# Patient Record
Sex: Female | Born: 1990 | Race: Black or African American | Hispanic: No | Marital: Single | Smoking: Never smoker
Health system: Southern US, Community
[De-identification: ages and names within clinical notes are randomized; demographics above are authoritative.]

## PROBLEM LIST (undated history)

## (undated) ENCOUNTER — Inpatient Hospital Stay: Payer: Self-pay

## (undated) ENCOUNTER — Emergency Department: Admission: EM | Payer: Medicaid Other | Source: Home / Self Care

## (undated) DIAGNOSIS — N83209 Unspecified ovarian cyst, unspecified side: Secondary | ICD-10-CM

## (undated) DIAGNOSIS — K529 Noninfective gastroenteritis and colitis, unspecified: Secondary | ICD-10-CM

---

## 2013-03-10 ENCOUNTER — Encounter (HOSPITAL_COMMUNITY): Payer: Self-pay | Admitting: Emergency Medicine

## 2013-03-10 ENCOUNTER — Emergency Department (HOSPITAL_COMMUNITY)
Admission: EM | Admit: 2013-03-10 | Discharge: 2013-03-10 | Disposition: A | Discharge status: Home / Self Care | Payer: No Typology Code available for payment source | Attending: Emergency Medicine | Admitting: Emergency Medicine

## 2013-03-10 DIAGNOSIS — Y92410 Unspecified street and highway as the place of occurrence of the external cause: Secondary | ICD-10-CM | POA: Insufficient documentation

## 2013-03-10 DIAGNOSIS — S50812A Abrasion of left forearm, initial encounter: Secondary | ICD-10-CM

## 2013-03-10 DIAGNOSIS — Y9389 Activity, other specified: Secondary | ICD-10-CM | POA: Insufficient documentation

## 2013-03-10 DIAGNOSIS — IMO0002 Reserved for concepts with insufficient information to code with codable children: Secondary | ICD-10-CM | POA: Insufficient documentation

## 2013-03-10 DIAGNOSIS — Z880 Allergy status to penicillin: Secondary | ICD-10-CM | POA: Insufficient documentation

## 2013-03-10 LAB — URINALYSIS, ROUTINE W REFLEX MICROSCOPIC
Bilirubin Urine: NEGATIVE
Glucose, UA: NEGATIVE mg/dL
Hgb urine dipstick: NEGATIVE
Ketones, ur: NEGATIVE mg/dL
Protein, ur: NEGATIVE mg/dL
Urobilinogen, UA: 0.2 mg/dL (ref 0.0–1.0)

## 2013-03-10 MED ORDER — IBUPROFEN 800 MG PO TABS: 800.0000 mg | ORAL_TABLET | Freq: Three times a day (TID) | ORAL | Status: DC

## 2013-03-10 MED ORDER — IBUPROFEN 400 MG PO TABS
600.0000 mg | ORAL_TABLET | Freq: Once | ORAL | Status: AC
  Administered 2013-03-10: 600 mg via ORAL
  Filled 2013-03-10: qty 1
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
## 2013-03-11 NOTE — ED Provider Notes (Signed)
Medical screening examination/treatment/procedure(s) were performed by non-physician practitioner and as supervising physician I was immediately available for consultation/collaboration.  Arley Phenix, MD 03/11/13 [phone]

## 2013-12-15 ENCOUNTER — Emergency Department (INDEPENDENT_AMBULATORY_CARE_PROVIDER_SITE_OTHER)
Admission: EM | Admit: 2013-12-15 | Discharge: 2013-12-15 | Disposition: A | Discharge status: Home / Self Care | Payer: Self-pay | Source: Home / Self Care | Attending: Emergency Medicine | Admitting: Emergency Medicine

## 2013-12-15 ENCOUNTER — Encounter (HOSPITAL_COMMUNITY): Payer: Self-pay | Admitting: Emergency Medicine

## 2013-12-15 ENCOUNTER — Other Ambulatory Visit (HOSPITAL_COMMUNITY)
Admission: RE | Admit: 2013-12-15 | Discharge: 2013-12-15 | Disposition: A | Discharge status: Home / Self Care | Payer: Medicaid Other | Source: Ambulatory Visit | Attending: Emergency Medicine | Admitting: Emergency Medicine

## 2013-12-15 DIAGNOSIS — Z202 Contact with and (suspected) exposure to infections with a predominantly sexual mode of transmission: Secondary | ICD-10-CM

## 2013-12-15 DIAGNOSIS — Z113 Encounter for screening for infections with a predominantly sexual mode of transmission: Secondary | ICD-10-CM | POA: Insufficient documentation

## 2013-12-15 DIAGNOSIS — N760 Acute vaginitis: Secondary | ICD-10-CM | POA: Insufficient documentation

## 2013-12-15 LAB — POCT URINALYSIS DIP (DEVICE)
Bilirubin Urine: NEGATIVE
GLUCOSE, UA: NEGATIVE mg/dL
Ketones, ur: NEGATIVE mg/dL
Leukocytes, UA: NEGATIVE
NITRITE: NEGATIVE
Protein, ur: NEGATIVE mg/dL
Specific Gravity, Urine: 1.025 (ref 1.005–1.030)
UROBILINOGEN UA: 0.2 mg/dL (ref 0.0–1.0)
pH: 7 (ref 5.0–8.0)

## 2013-12-15 LAB — POCT PREGNANCY, URINE: PREG TEST UR: NEGATIVE

## 2013-12-15 NOTE — ED Notes (Signed)
Call back number for lab issues verified 

## 2013-12-15 NOTE — ED Provider Notes (Signed)
  Chief Complaint   Chief Complaint  Patient presents with  . Exposure to STD    History of Present Illness   Marilyn Rojas is a 23 year old female who is in today to be checked for STDs and she herself is asymptomatic. Her girlfriend is here today with a sore throat. The patient denies any sore throat, fever, chills, joint pain or skin rash. She's had no abdominal pain, pelvic pain, vaginal discharge, genital lesions, itching, or irritation.  Review of Systems   Other than as noted above, the patient denies any of the following symptoms: Systemic:  No fever or chills GI:  No abdominal pain, nausea, vomiting, diarrhea, constipation, melena or hematochezia. GU:  No dysuria, frequency, urgency, hematuria, vaginal discharge, itching, or abnormal vaginal bleeding.  PMFSH   Past medical history, family history, social history, meds, and allergies were reviewed.    Physical Examination    Vital signs:  BP 112/76  Pulse 68  Temp(Src) 98 F (36.7 C) (Oral)  Resp 16  SpO2 100% General:  Alert, oriented and in no distress. Lungs:  Breath sounds clear and equal bilaterally.  No wheezes, rales or rhonchi. Heart:  Regular rhythm.  No gallops or murmers. Abdomen:  Soft, flat and non-distended.  No organomegaly or mass.  No tenderness, guarding or rebound.  Bowel sounds normally active. Pelvic exam:  Normal external genitalia, vaginal and cervical mucosa were normal. No vaginal discharge, no pain on cervical motion, uterus was normal in size and shape and nontender. No adnexal masses or tenderness.  DNA probes for gonorrhea, Chlamydia, Trichomonas, Gardnerella, Candida were obtained. Skin:  Clear, warm and dry.  Labs   Results for orders placed during the hospital encounter of 12/15/13  POCT URINALYSIS DIP (DEVICE)      Result Value Ref Range   Glucose, UA NEGATIVE  NEGATIVE mg/dL   Bilirubin Urine NEGATIVE  NEGATIVE   Ketones, ur NEGATIVE  NEGATIVE mg/dL   Specific Gravity, Urine  1.025  1.005 - 1.030   Hgb urine dipstick TRACE (*) NEGATIVE   pH 7.0  5.0 - 8.0   Protein, ur NEGATIVE  NEGATIVE mg/dL   Urobilinogen, UA 0.2  0.0 - 1.0 mg/dL   Nitrite NEGATIVE  NEGATIVE   Leukocytes, UA NEGATIVE  NEGATIVE  POCT PREGNANCY, URINE      Result Value Ref Range   Preg Test, Ur NEGATIVE  NEGATIVE    Serologies for HIV and syphilis were obtained. Also a throat culture was obtained for gonorrhea and Chlamydia DNA probe.  Assessment   The encounter diagnosis was Potential exposure to STD.  No evidence of STDs right now. Will call patient back about any positive results.       Plan    1.  Meds:  The following meds were prescribed:   Discharge Medication List as of 12/15/2013  5:36 PM      2.  Patient Education/Counseling:  The patient was given appropriate handouts, self care instructions, and instructed in symptomatic relief.    3.  Follow up:  The patient was told to follow up here if no better in 3 to 4 days, or sooner if becoming worse in any way, and given some red flag symptoms such as worsening pain, fever, persistent vomiting, or heavy vaginal bleeding which would prompt immediate return.       Reuben Likesavid C Hadriel Northup, MD 12/15/13 [phone]

## 2013-12-15 NOTE — Discharge Instructions (Signed)
Sexually Transmitted Disease A sexually transmitted disease (STD) is a disease or infection that may be passed (transmitted) from person to person, usually during sexual activity. This may happen by way of saliva, semen, blood, vaginal mucus, or urine. Common STDs include:   Gonorrhea.   Chlamydia.   Syphilis.   HIV and AIDS.   Genital herpes.   Hepatitis B and C.   Trichomonas.   Human papillomavirus (HPV).   Pubic lice.   Scabies.  Mites.  Bacterial vaginosis. WHAT ARE CAUSES OF STDs? An STD may be caused by bacteria, a virus, or parasites. STDs are often transmitted during sexual activity if one person is infected. However, they may also be transmitted through nonsexual means. STDs may be transmitted after:   Sexual intercourse with an infected person.   Sharing sex toys with an infected person.   Sharing needles with an infected person or using unclean piercing or tattoo needles.  Having intimate contact with the genitals, mouth, or rectal areas of an infected person.   Exposure to infected fluids during birth. WHAT ARE THE SIGNS AND SYMPTOMS OF STDs? Different STDs have different symptoms. Some people may not have any symptoms. If symptoms are present, they may include:   Painful or bloody urination.   Pain in the pelvis, abdomen, vagina, anus, throat, or eyes.   Skin rash, itching, irritation, growths, sores (lesions), ulcerations, or warts in the genital or anal area.  Abnormal vaginal discharge with or without bad odor.   Penile discharge in men.   Fever.   Pain or bleeding during sexual intercourse.   Swollen glands in the groin area.   Yellow skin and eyes (jaundice). This is seen with hepatitis.   Swollen testicles.  Infertility.  Sores and blisters in the mouth. HOW ARE STDs DIAGNOSED? To make a diagnosis, your health care provider may:   Take a medical history.   Perform a physical exam.   Take a sample of any  discharge for examination.  Swab the throat, cervix, opening to the penis, rectum, or vagina for testing.  Test a sample of your first morning urine.   Perform blood tests.   Perform a Pap smear, if this applies.   Perform a colposcopy.   Perform a laparoscopy.  HOW ARE STDs TREATED? Treatment depends on the STD. Some STDs may be treated but not cured.   Chlamydia, gonorrhea, trichomonas, and syphilis can be cured with antibiotics.   Genital herpes, hepatitis, and HIV can be treated, but not cured, with prescribed medicines. The medicines lessen symptoms.   Genital warts from HPV can be treated with medicine or by freezing, burning (electrocautery), or surgery. Warts may come back.   HPV cannot be cured with medicine or surgery. However, abnormal areas may be removed from the cervix, vagina, or vulva.   If your diagnosis is confirmed, your recent sexual partners need treatment. This is true even if they are symptom-free or have a negative culture or evaluation. They should not have sex until their health care providers say it is OK. HOW CAN I REDUCE MY RISK OF GETTING AN STD?  Use latex condoms, dental dams, and water-soluble lubricants during sexual activity. Do not use petroleum jelly or oils.  Get vaccinated for HPV and hepatitis. If you have not received these vaccines in the past, talk to your health care provider about whether one or both might be right for you.   Avoid risky sex practices that can break the skin.  WHAT SHOULD   I DO IF I THINK I HAVE AN STD?  See your health care provider.   Inform all sexual partners. They should be tested and treated for any STDs.  Do not have sex until your health care provider says it is OK. WHEN SHOULD I GET HELP? Seek immediate medical care if:  You develop severe abdominal pain.  You are a man and notice swelling or pain in the testicles.  You are a woman and notice swelling or pain in your vagina. Document  Released: 12/07/2002 Document Revised: 07/07/2013 Document Reviewed: 04/06/2013 [employer] Patient Information 2014 [employer]  

## 2013-12-15 NOTE — ED Notes (Signed)
Pt not having any symptoms, but requesting STD testing , since her partner is c/o ST

## 2013-12-16 LAB — CERVICOVAGINAL ANCILLARY ONLY
CHLAMYDIA, DNA PROBE: NEGATIVE
NEISSERIA GONORRHEA: NEGATIVE
WET PREP (BD AFFIRM): NEGATIVE
Wet Prep (BD Affirm): NEGATIVE
Wet Prep (BD Affirm): NEGATIVE

## 2013-12-16 LAB — RPR: RPR Ser Ql: NONREACTIVE

## 2013-12-16 LAB — HIV ANTIBODY (ROUTINE TESTING W REFLEX): HIV: NONREACTIVE

## 2013-12-16 LAB — CYTOLOGY, (ORAL, ANAL, URETHRAL) ANCILLARY ONLY
Chlamydia: NEGATIVE
Neisseria Gonorrhea: NEGATIVE

## 2014-11-30 ENCOUNTER — Emergency Department: Payer: Self-pay | Admitting: Emergency Medicine

## 2015-08-23 ENCOUNTER — Encounter: Payer: Self-pay | Admitting: Emergency Medicine

## 2015-08-23 ENCOUNTER — Emergency Department
Admission: EM | Admit: 2015-08-23 | Discharge: 2015-08-23 | Disposition: A | Discharge status: Home / Self Care | Payer: Medicaid Other | Attending: Emergency Medicine | Admitting: Emergency Medicine

## 2015-08-23 ENCOUNTER — Emergency Department: Payer: Medicaid Other

## 2015-08-23 DIAGNOSIS — Z3491 Encounter for supervision of normal pregnancy, unspecified, first trimester: Secondary | ICD-10-CM

## 2015-08-23 DIAGNOSIS — Z880 Allergy status to penicillin: Secondary | ICD-10-CM | POA: Diagnosis not present

## 2015-08-23 DIAGNOSIS — N898 Other specified noninflammatory disorders of vagina: Secondary | ICD-10-CM | POA: Diagnosis not present

## 2015-08-23 DIAGNOSIS — Z3A01 Less than 8 weeks gestation of pregnancy: Secondary | ICD-10-CM | POA: Insufficient documentation

## 2015-08-23 DIAGNOSIS — O23591 Infection of other part of genital tract in pregnancy, first trimester: Secondary | ICD-10-CM | POA: Insufficient documentation

## 2015-08-23 DIAGNOSIS — O26891 Other specified pregnancy related conditions, first trimester: Secondary | ICD-10-CM

## 2015-08-23 LAB — URINALYSIS COMPLETE WITH MICROSCOPIC (ARMC ONLY)
BILIRUBIN URINE: NEGATIVE
GLUCOSE, UA: NEGATIVE mg/dL
Hgb urine dipstick: NEGATIVE
KETONES UR: NEGATIVE mg/dL
LEUKOCYTES UA: NEGATIVE
Nitrite: NEGATIVE
PROTEIN: NEGATIVE mg/dL
SPECIFIC GRAVITY, URINE: 1.006 (ref 1.005–1.030)
pH: 6 (ref 5.0–8.0)

## 2015-08-23 LAB — TYPE AND SCREEN
ABO/RH(D): A POS
Antibody Screen: NEGATIVE

## 2015-08-23 LAB — CHLAMYDIA/NGC RT PCR (ARMC ONLY)
CHLAMYDIA TR: NOT DETECTED
N GONORRHOEAE: NOT DETECTED

## 2015-08-23 LAB — WET PREP, GENITAL
SPERM: NONE SEEN
Trich, Wet Prep: NONE SEEN
Yeast Wet Prep HPF POC: NONE SEEN

## 2015-08-23 LAB — ABO/RH: ABO/RH(D): A POS

## 2015-08-23 LAB — HCG, QUANTITATIVE, PREGNANCY: hCG, Beta Chain, Quant, S: 128572 m[IU]/mL — ABNORMAL HIGH (ref <5)

## 2015-08-23 MED ORDER — METRONIDAZOLE 500 MG PO TABS: 500.0000 mg | ORAL_TABLET | Freq: Two times a day (BID) | ORAL | Status: DC

## 2015-08-23 MED ORDER — CEFTRIAXONE SODIUM 250 MG IJ SOLR
250.0000 mg | INTRAMUSCULAR | Status: DC
  Administered 2015-08-23: 250 mg via INTRAMUSCULAR
  Filled 2015-08-23: qty 250

## 2015-08-23 MED ORDER — AZITHROMYCIN 250 MG PO TABS
1000.0000 mg | ORAL_TABLET | Freq: Once | ORAL | Status: AC
  Administered 2015-08-23: 1000 mg via ORAL
  Filled 2015-08-23: qty 4

## 2015-08-23 NOTE — Discharge Instructions (Signed)
First Trimester of Pregnancy °The first trimester of pregnancy is from week 1 until the end of week 12 (months 1 through 3). A week after a sperm fertilizes an egg, the egg will implant on the wall of the uterus. This embryo will begin to develop into a baby. Genes from you and your partner are forming the baby. The female genes determine whether the baby is a boy or a girl. At 6-8 weeks, the eyes and face are formed, and the heartbeat can be seen on ultrasound. At the end of 12 weeks, all the baby's organs are formed.  °Now that you are pregnant, you will want to do everything you can to have a healthy baby. Two of the most important things are to get good prenatal care and to follow your health care provider's instructions. Prenatal care is all the medical care you receive before the baby's birth. This care will help prevent, find, and treat any problems during the pregnancy and childbirth. °BODY CHANGES °Your body goes through many changes during pregnancy. The changes vary from woman to woman.  °· You may gain or lose a couple of pounds at first. °· You may feel sick to your stomach (nauseous) and throw up (vomit). If the vomiting is uncontrollable, call your health care provider. °· You may tire easily. °· You may develop headaches that can be relieved by medicines approved by your health care provider. °· You may urinate more often. Painful urination may mean you have a bladder infection. °· You may develop heartburn as a result of your pregnancy. °· You may develop constipation because certain hormones are causing the muscles that push waste through your intestines to slow down. °· You may develop hemorrhoids or swollen, bulging veins (varicose veins). °· Your breasts may begin to grow larger and become tender. Your nipples may stick out more, and the tissue that surrounds them (areola) may become darker. °· Your gums may bleed and may be sensitive to brushing and flossing. °· Dark spots or blotches (chloasma,  mask of pregnancy) may develop on your face. This will likely fade after the baby is born. °· Your menstrual periods will stop. °· You may have a loss of appetite. °· You may develop cravings for certain kinds of food. °· You may have changes in your emotions from day to day, such as being excited to be pregnant or being concerned that something may go wrong with the pregnancy and baby. °· You may have more vivid and strange dreams. °· You may have changes in your hair. These can include thickening of your hair, rapid growth, and changes in texture. Some women also have hair loss during or after pregnancy, or hair that feels dry or thin. Your hair will most likely return to normal after your baby is born. °WHAT TO EXPECT AT YOUR PRENATAL VISITS °During a routine prenatal visit: °· You will be weighed to make sure you and the baby are growing normally. °· Your blood pressure will be taken. °· Your abdomen will be measured to track your baby's growth. °· The fetal heartbeat will be listened to starting around week 10 or 12 of your pregnancy. °· Test results from any previous visits will be discussed. °Your health care provider may ask you: °· How you are feeling. °· If you are feeling the baby move. °· If you have had any abnormal symptoms, such as leaking fluid, bleeding, severe headaches, or abdominal cramping. °· If you are using any tobacco products,   including cigarettes, chewing tobacco, and electronic cigarettes. °· If you have any questions. °Other tests that may be performed during your first trimester include: °· Blood tests to find your blood type and to check for the presence of any previous infections. They will also be used to check for low iron levels (anemia) and Rh antibodies. Later in the pregnancy, blood tests for diabetes will be done along with other tests if problems develop. °· Urine tests to check for infections, diabetes, or protein in the urine. °· An ultrasound to confirm the proper growth  and development of the baby. °· An amniocentesis to check for possible genetic problems. °· Fetal screens for spina bifida and Down syndrome. °· You may need other tests to make sure you and the baby are doing well. °· HIV (human immunodeficiency virus) testing. Routine prenatal testing includes screening for HIV, unless you choose not to have this test. °HOME CARE INSTRUCTIONS  °Medicines °· Follow your health care provider's instructions regarding medicine use. Specific medicines may be either safe or unsafe to take during pregnancy. °· Take your prenatal vitamins as directed. °· If you develop constipation, try taking a stool softener if your health care provider approves. °Diet °· Eat regular, well-balanced meals. Choose a variety of foods, such as meat or vegetable-based protein, fish, milk and low-fat dairy products, vegetables, fruits, and whole grain breads and cereals. Your health care provider will help you determine the amount of weight gain that is right for you. °· Avoid raw meat and uncooked cheese. These carry germs that can cause birth defects in the baby. °· Eating four or five small meals rather than three large meals a day may help relieve nausea and vomiting. If you start to feel nauseous, eating a few soda crackers can be helpful. Drinking liquids between meals instead of during meals also seems to help nausea and vomiting. °· If you develop constipation, eat more high-fiber foods, such as fresh vegetables or fruit and whole grains. Drink enough fluids to keep your urine clear or pale yellow. °Activity and Exercise °· Exercise only as directed by your health care provider. Exercising will help you: °¨ Control your weight. °¨ Stay in shape. °¨ Be prepared for labor and delivery. °· Experiencing pain or cramping in the lower abdomen or low back is a good sign that you should stop exercising. Check with your health care provider before continuing normal exercises. °· Try to avoid standing for long  periods of time. Move your legs often if you must stand in one place for a long time. °· Avoid heavy lifting. °· Wear low-heeled shoes, and practice good posture. °· You may continue to have sex unless your health care provider directs you otherwise. °Relief of Pain or Discomfort °· Wear a good support bra for breast tenderness.   °· Take warm sitz baths to soothe any pain or discomfort caused by hemorrhoids. Use hemorrhoid cream if your health care provider approves.   °· Rest with your legs elevated if you have leg cramps or low back pain. °· If you develop varicose veins in your legs, wear support hose. Elevate your feet for 15 minutes, 3-4 times a day. Limit salt in your diet. °Prenatal Care °· Schedule your prenatal visits by the twelfth week of pregnancy. They are usually scheduled monthly at first, then more often in the last 2 months before delivery. °· Write down your questions. Take them to your prenatal visits. °· Keep all your prenatal visits as directed by your   health care provider. °Safety °· Wear your seat belt at all times when driving. °· Make a list of emergency phone numbers, including numbers for family, friends, the hospital, and police and fire departments. °General Tips °· Ask your health care provider for a referral to a local prenatal education class. Begin classes no later than at the beginning of month 6 of your pregnancy. °· Ask for help if you have counseling or nutritional needs during pregnancy. Your health care provider can offer advice or refer you to specialists for help with various needs. °· Do not use hot tubs, steam rooms, or saunas. °· Do not douche or use tampons or scented sanitary pads. °· Do not cross your legs for long periods of time. °· Avoid cat litter boxes and soil used by cats. These carry germs that can cause birth defects in the baby and possibly loss of the fetus by miscarriage or stillbirth. °· Avoid all smoking, herbs, alcohol, and medicines not prescribed by  your health care provider. Chemicals in these affect the formation and growth of the baby. °· Do not use any tobacco products, including cigarettes, chewing tobacco, and electronic cigarettes. If you need help quitting, ask your health care provider. You may receive counseling support and other resources to help you quit. °· Schedule a dentist appointment. At home, brush your teeth with a soft toothbrush and be gentle when you floss. °SEEK MEDICAL CARE IF:  °· You have dizziness. °· You have mild pelvic cramps, pelvic pressure, or nagging pain in the abdominal area. °· You have persistent nausea, vomiting, or diarrhea. °· You have a bad smelling vaginal discharge. °· You have pain with urination. °· You notice increased swelling in your face, hands, legs, or ankles. °SEEK IMMEDIATE MEDICAL CARE IF:  °· You have a fever. °· You are leaking fluid from your vagina. °· You have spotting or bleeding from your vagina. °· You have severe abdominal cramping or pain. °· You have rapid weight gain or loss. °· You vomit blood or material that looks like coffee grounds. °· You are exposed to German measles and have never had them. °· You are exposed to fifth disease or chickenpox. °· You develop a severe headache. °· You have shortness of breath. °· You have any kind of trauma, such as from a fall or a car accident. °  °This information is not intended to replace advice given to you by your health care provider. Make sure you discuss any questions you have with your health care provider. °  °Document Released: 09/10/2001 Document Revised: 10/07/2014 Document Reviewed: 07/27/2013 °Elsevier Interactive Patient Education ©2016 [employer] °Bacterial Vaginosis °Bacterial vaginosis is a vaginal infection that occurs when the normal balance of bacteria in the vagina is disrupted. It results from an overgrowth of certain bacteria. This is the most common vaginal infection in women of childbearing age. Treatment is important to  prevent complications, especially in pregnant women, as it can cause a premature delivery. °CAUSES  °Bacterial vaginosis is caused by an increase in harmful bacteria that are normally present in smaller amounts in the vagina. Several different kinds of bacteria can cause bacterial vaginosis. However, the reason that the condition develops is not fully understood. °RISK FACTORS °Certain activities or behaviors can put you at an increased risk of developing bacterial vaginosis, including: °· Having a new sex partner or multiple sex partners. °· Douching. °· Using an intrauterine device (IUD) for contraception. °Women do not get bacterial vaginosis from toilet seats,   bedding, swimming pools, or contact with objects around them. °SIGNS AND SYMPTOMS  °Some women with bacterial vaginosis have no signs or symptoms. Common symptoms include: °· Grey vaginal discharge. °· A fishlike odor with discharge, especially after sexual intercourse. °· Itching or burning of the vagina and vulva. °· Burning or pain with urination. °DIAGNOSIS  °Your health care provider will take a medical history and examine the vagina for signs of bacterial vaginosis. A sample of vaginal fluid may be taken. Your health care provider will look at this sample under a microscope to check for bacteria and abnormal cells. A vaginal pH test may also be done.  °TREATMENT  °Bacterial vaginosis may be treated with antibiotic medicines. These may be given in the form of a pill or a vaginal cream. A second round of antibiotics may be prescribed if the condition comes back after treatment. Because bacterial vaginosis increases your risk for sexually transmitted diseases, getting treated can help reduce your risk for chlamydia, gonorrhea, HIV, and herpes. °HOME CARE INSTRUCTIONS  °· Only take over-the-counter or prescription medicines as directed by your health care provider. °· If antibiotic medicine was prescribed, take it as directed. Make sure you finish it  even if you start to feel better. °· Tell all sexual partners that you have a vaginal infection. They should see their health care provider and be treated if they have problems, such as a mild rash or itching. °· During treatment, it is important that you follow these instructions: °¨ Avoid sexual activity or use condoms correctly. °¨ Do not douche. °¨ Avoid alcohol as directed by your health care provider. °¨ Avoid breastfeeding as directed by your health care provider. °SEEK MEDICAL CARE IF:  °· Your symptoms are not improving after 3 days of treatment. °· You have increased discharge or pain. °· You have a fever. °MAKE SURE YOU:  °· Understand these instructions. °· Will watch your condition. °· Will get help right away if you are not doing well or get worse. °FOR MORE INFORMATION  °Centers for Disease Control and Prevention, Division of STD Prevention: www.cdc.gov/std °American Sexual Health Association (ASHA): www.ashastd.org  °  °This information is not intended to replace advice given to you by your health care provider. Make sure you discuss any questions you have with your health care provider. °  °Document Released: 09/16/2005 Document Revised: 10/07/2014 Document Reviewed: 04/28/2013 °Elsevier Interactive Patient Education ©2016 [employer]vier Inc. ° °

## 2015-08-23 NOTE — ED Provider Notes (Addendum)
[facility] Emergency Department Provider Note     Time seen: ----------------------------------------- 3:21 PM on 08/23/2015 -----------------------------------------    I have reviewed the triage vital signs and the nursing notes.   HISTORY  Chief Complaint Vaginal Discharge and Possible Pregnancy    HPI Marilyn Rojas is a 24 y.o. female who presents ER with brown vaginal discharge today with some cramping. Patient states she is around [redacted] weeks pregnant, [facility]. Denies fevers chills or other complaints. Patient states she is G2 P1 Ab1. She had an elective abortion around 9 weeks and her last pregnancy. She denies any significant abdominal pain.   History reviewed. No pertinent past medical history.  There are no active problems to display for this patient.   History reviewed. No pertinent past surgical history.  Allergies Amoxicillin  Social History Social History  Substance Use Topics  . Smoking status: Never Smoker   . Smokeless tobacco: None  . Alcohol Use: No    Review of Systems Constitutional: Negative for fever. Eyes: Negative for visual changes. ENT: Negative for sore throat. Cardiovascular: Negative for chest pain. Respiratory: Negative for shortness of breath. Gastrointestinal: Negative for abdominal pain, vomiting and diarrhea. Genitourinary: Negative for dysuria. Positive for brown vaginal discharge Musculoskeletal: Negative for back pain. Skin: Negative for rash. Neurological: Negative for headaches, focal weakness or numbness.  10-point ROS otherwise negative.  ____________________________________________   PHYSICAL EXAM:  VITAL SIGNS: ED Triage Vitals  Enc Vitals Group     BP 08/23/15 1420 126/84 mmHg     Pulse Rate 08/23/15 1420 87     Resp 08/23/15 1420 18     Temp 08/23/15 1420 98.4 F (36.9 C)     Temp Source 08/23/15 1420 Oral     SpO2 08/23/15 1420 100 %   Weight 08/23/15 1420 107 lb (48.535 kg)     Height 08/23/15 1420 5\' 4"  (1.626 m)     Head Cir --      Peak Flow --      Pain Score 08/23/15 1421 2     Pain Loc --      Pain Edu? --      Excl. in GC? --     Constitutional: Alert and oriented. Well appearing and in no distress. Eyes: Conjunctivae are normal. PERRL. Normal extraocular movements. ENT   Head: Normocephalic and atraumatic.   Nose: No congestion/rhinnorhea.   Mouth/Throat: Mucous membranes are moist.   Neck: No stridor. Cardiovascular: Normal rate, regular rhythm. Normal and symmetric distal pulses are present in all extremities. No murmurs, rubs, or gallops. Respiratory: Normal respiratory effort without tachypnea nor retractions. Breath sounds are clear and equal bilaterally. No wheezes/rales/rhonchi. Gastrointestinal: Soft and nontender. No distention. No abdominal bruits.  Genitourinary: There is thin brown vaginal discharge, cervix is closed. No cervical motion tenderness. Musculoskeletal: Nontender with normal range of motion in all extremities. No joint effusions.  No lower extremity tenderness nor edema. Neurologic:  Normal speech and language. No gross focal neurologic deficits are appreciated. Speech is normal. No gait instability. Skin:  Skin is warm, dry and intact. No rash noted. Psychiatric: Mood and affect are normal. Speech and behavior are normal. Patient exhibits appropriate insight and judgment. ____________________________________________  ED COURSE:  Pertinent labs & imaging results that were available during my care of the patient were reviewed by me and considered in my medical decision making (see chart for details). Patient's acute distress, will check basic labs and urine  and wet prep. Patient will likely need ultrasound as well ____________________________________________    LABS (pertinent positives/negatives)  Labs Reviewed  WET PREP, GENITAL - Abnormal; Notable for the  following:    Clue Cells Wet Prep HPF POC FEW (*)    WBC, Wet Prep HPF POC MANY (*)    All other components within normal limits  HCG, QUANTITATIVE, PREGNANCY - Abnormal; Notable for the following:    hCG, Beta Chain, Quant, [place] [id number] (*)    All other components within normal limits  URINALYSIS COMPLETEWITH MICROSCOPIC ([facility] ONLY) - Abnormal; Notable for the following:    Color, Urine YELLOW (*)    APPearance CLEAR (*)    Bacteria, UA RARE (*)    Squamous Epithelial / LPF 0-5 (*)    All other components within normal limits  CHLAMYDIA/NGC RT PCR ([facility] ONLY)  CBC WITH DIFFERENTIAL/PLATELET  BASIC METABOLIC PANEL  TYPE AND SCREEN  ABO/RH    RADIOLOGY  Pregnancy ultrasound IMPRESSION: Six week 5 day intrauterine pregnancy. Fetal heart rate 130 beats per minute. No acute maternal findings. ____________________________________________  FINAL ASSESSMENT AND PLAN  Vaginal discharge, first trimester pregnancy  Plan: Patient with labs and imaging as dictated above. Patient was given Rocephin and Zithromax to cover for her discharge. She is encouraged to have close follow-up with her doctor for reevaluation.   Emily Filbert, MD   Emily Filbert, MD 08/23/15 1640  Emily Filbert, MD 08/23/15 [phone]

## 2015-08-23 NOTE — ED Notes (Signed)
Pt presents with brown vaginal discharge today with some crampnig once and she is app [redacted] weeks pregnant. [facility].

## 2015-12-21 ENCOUNTER — Emergency Department
Admission: EM | Admit: 2015-12-21 | Discharge: 2015-12-21 | Disposition: A | Discharge status: Home / Self Care | Payer: Medicaid Other | Attending: Emergency Medicine | Admitting: Emergency Medicine

## 2015-12-21 ENCOUNTER — Encounter: Payer: Self-pay | Admitting: Emergency Medicine

## 2015-12-21 DIAGNOSIS — Z3490 Encounter for supervision of normal pregnancy, unspecified, unspecified trimester: Secondary | ICD-10-CM

## 2015-12-21 DIAGNOSIS — Z3A23 23 weeks gestation of pregnancy: Secondary | ICD-10-CM | POA: Insufficient documentation

## 2015-12-21 DIAGNOSIS — N760 Acute vaginitis: Secondary | ICD-10-CM

## 2015-12-21 DIAGNOSIS — Z792 Long term (current) use of antibiotics: Secondary | ICD-10-CM | POA: Insufficient documentation

## 2015-12-21 DIAGNOSIS — O23592 Infection of other part of genital tract in pregnancy, second trimester: Secondary | ICD-10-CM | POA: Insufficient documentation

## 2015-12-21 DIAGNOSIS — B9689 Other specified bacterial agents as the cause of diseases classified elsewhere: Secondary | ICD-10-CM

## 2015-12-21 DIAGNOSIS — Z791 Long term (current) use of non-steroidal anti-inflammatories (NSAID): Secondary | ICD-10-CM | POA: Insufficient documentation

## 2015-12-21 LAB — WET PREP, GENITAL
Sperm: NONE SEEN
Trich, Wet Prep: NONE SEEN
Yeast Wet Prep HPF POC: NONE SEEN

## 2015-12-21 MED ORDER — METRONIDAZOLE 500 MG PO TABS: 500.0000 mg | ORAL_TABLET | Freq: Two times a day (BID) | ORAL | Status: AC

## 2015-12-21 MED ORDER — METRONIDAZOLE 500 MG PO TABS
500.0000 mg | ORAL_TABLET | Freq: Once | ORAL | Status: AC
  Administered 2015-12-21: 500 mg via ORAL
  Filled 2015-12-21: qty 1

## 2015-12-21 NOTE — ED Notes (Signed)
Pt comes into the ED via POV c/o vaginal itching and discharge.  Patient seen 1 week ago for similar problems and given metronidazole.  Patient has completed antibiotic.  Patient is [redacted] weeks pregnant.

## 2015-12-21 NOTE — ED Provider Notes (Signed)
[facility] Emergency Department Provider Note  ____________________________________________  Time seen: Approximately 5:47 PM  I have reviewed the triage vital signs and the nursing notes.   HISTORY  Chief Complaint Vaginal Itching    HPI Marilyn Rojas is a 25 y.o. female who is [redacted] weeks pregnant and was seen by her physician last week, 3/15. She was diagnosed with bacterial vaginosis and given metronidazole gel.During the last few days she has developed vaginal your dictation with itching. She denies any abdominal pain or pelvic pain. She denies any fevers or chills. No chest pain shortness of breath. She has fatigue, but no nausea or vomiting. She denies STD exposure. She reports being tested last week at her family practice.   History reviewed. No pertinent past medical history.  There are no active problems to display for this patient.   History reviewed. No pertinent past surgical history.  Current Outpatient Rx  Name  Route  Sig  Dispense  Refill  . ibuprofen (ADVIL,MOTRIN) 800 MG tablet   Oral   Take 1 tablet (800 mg total) by mouth 3 (three) times daily.   21 tablet   0   . loratadine (CLARITIN) 10 MG tablet   Oral   Take 10 mg by mouth daily.         . metroNIDAZOLE (FLAGYL) 500 MG tablet   Oral   Take 1 tablet (500 mg total) by mouth 2 (two) times daily.   14 tablet   0   . metroNIDAZOLE (FLAGYL) 500 MG tablet   Oral   Take 1 tablet (500 mg total) by mouth 2 (two) times daily.   14 tablet   0   . norgestimate-ethinyl estradiol (ORTHO-CYCLEN,SPRINTEC,PREVIFEM) 0.25-35 MG-MCG tablet   Oral   Take 1 tablet by mouth daily.           Allergies Amoxicillin  No family history on file.  Social History Social History  Substance Use Topics  . Smoking status: Never Smoker   . Smokeless tobacco: None  . Alcohol Use: No    Review of Systems Constitutional: No fever/chills Eyes: No visual changes. ENT: No sore  throat. Cardiovascular: Denies chest pain. Respiratory: Denies shortness of breath. Gastrointestinal: No abdominal pain.  No nausea, no vomiting.  No diarrhea.  No constipation. Genitourinary: Negative for dysuria. Musculoskeletal: Negative for back pain. Skin: Negative for rash. Neurological: Negative for headaches, focal weakness or numbness. 10-point ROS otherwise negative.  ____________________________________________   PHYSICAL EXAM:  VITAL SIGNS: ED Triage Vitals  Enc Vitals Group     BP 12/21/15 1732 99/67 mmHg     Pulse Rate 12/21/15 1732 92     Resp 12/21/15 1732 16     Temp 12/21/15 1732 98.4 F (36.9 C)     Temp Source 12/21/15 1732 Oral     SpO2 12/21/15 1732 100 %     Weight 12/21/15 1732 126 lb (57.153 kg)     Height 12/21/15 1732 5\' 4"  (1.626 m)     Head Cir --      Peak Flow --      Pain Score 12/21/15 1732 5     Pain Loc --      Pain Edu? --      Excl. in GC? --     Constitutional: Alert and oriented. Well appearing and in no acute distress. Eyes: Conjunctivae are normal.. EOMI.\ Mouth/Throat: Mucous membranes are moist.  Neck:  Supple.  No adenopathy.   Cardiovascular: Normal rate, regular  rhythm. Grossly normal heart sounds.  Good peripheral circulation. Respiratory: Normal respiratory effort.  No retractions. Lungs CTAB. Gastrointestinal: Soft and nontender. No distention. No abdominal bruits. No CVA tenderness. GU: milky white discharge noted from the vagina Neurologic:  Normal speech and language. No gross focal neurologic deficits are appreciated. No gait instability. Skin:  Skin is warm, dry and intact. No rash noted. Psychiatric: Mood and affect are normal. Speech and behavior are normal.  ____________________________________________   LABS (all labs ordered are listed, but only abnormal results are displayed)  Labs Reviewed  WET PREP, GENITAL - Abnormal; Notable for the following:    Clue Cells Wet Prep HPF POC PRESENT (*)    WBC, Wet  Prep HPF POC MODERATE (*)    All other components within normal limits   ____________________________________________  EKG    ____________________________________________  RADIOLOGY    ____________________________________________   PROCEDURES  Procedure(s) performed: None  Critical Care performed: No  ____________________________________________   INITIAL IMPRESSION / ASSESSMENT AND PLAN / ED COURSE  Pertinent labs & imaging results that were available during my care of the patient were reviewed by me and considered in my medical decision making (see chart for details).  25 year old with vaginal itching and irritation, wet prep showing clue cells. Given metronidazole 500 mg twice a day for 1 week. She can follow-up with her obstetrician ____________________________________________   FINAL CLINICAL IMPRESSION(S) / ED DIAGNOSES  Final diagnoses:  BV (bacterial vaginosis)  Pregnancy      Ignacia Bayley, PA-C 12/21/15 1901  Loleta Rose, MD 12/21/15 2054

## 2015-12-21 NOTE — Discharge Instructions (Signed)
Bacterial Vaginosis Bacterial vaginosis is a vaginal infection that occurs when the normal balance of bacteria in the vagina is disrupted. It results from an overgrowth of certain bacteria. This is the most common vaginal infection in women of childbearing age. Treatment is important to prevent complications, especially in pregnant women, as it can cause a premature delivery. CAUSES  Bacterial vaginosis is caused by an increase in harmful bacteria that are normally present in smaller amounts in the vagina. Several different kinds of bacteria can cause bacterial vaginosis. However, the reason that the condition develops is not fully understood. RISK FACTORS Certain activities or behaviors can put you at an increased risk of developing bacterial vaginosis, including:  Having a new sex partner or multiple sex partners.  Douching.  Using an intrauterine device (IUD) for contraception. Women do not get bacterial vaginosis from toilet seats, bedding, swimming pools, or contact with objects around them. SIGNS AND SYMPTOMS  Some women with bacterial vaginosis have no signs or symptoms. Common symptoms include:  Grey vaginal discharge.  A fishlike odor with discharge, especially after sexual intercourse.  Itching or burning of the vagina and vulva.  Burning or pain with urination. DIAGNOSIS  Your health care provider will take a medical history and examine the vagina for signs of bacterial vaginosis. A sample of vaginal fluid may be taken. Your health care provider will look at this sample under a microscope to check for bacteria and abnormal cells. A vaginal pH test may also be done.  TREATMENT  Bacterial vaginosis may be treated with antibiotic medicines. These may be given in the form of a pill or a vaginal cream. A second round of antibiotics may be prescribed if the condition comes back after treatment. Because bacterial vaginosis increases your risk for sexually transmitted diseases, getting  treated can help reduce your risk for chlamydia, gonorrhea, HIV, and herpes. HOME CARE INSTRUCTIONS   Only take over-the-counter or prescription medicines as directed by your health care provider.  If antibiotic medicine was prescribed, take it as directed. Make sure you finish it even if you start to feel better.  Tell all sexual partners that you have a vaginal infection. They should see their health care provider and be treated if they have problems, such as a mild rash or itching.  During treatment, it is important that you follow these instructions:  Avoid sexual activity or use condoms correctly.  Do not douche.  Avoid alcohol as directed by your health care provider.  Avoid breastfeeding as directed by your health care provider. SEEK MEDICAL CARE IF:   Your symptoms are not improving after 3 days of treatment.  You have increased discharge or pain.  You have a fever. MAKE SURE YOU:   Understand these instructions.  Will watch your condition.  Will get help right away if you are not doing well or get worse. FOR MORE INFORMATION  Centers for Disease Control and Prevention, Division of STD Prevention: SolutionApps.co.zawww.cdc.gov/std American Sexual Health Association (ASHA): www.ashastd.org    This information is not intended to replace advice given to you by your health care provider. Make sure you discuss any questions you have with your health care provider.   Document Released: 09/16/2005 Document Revised: 10/07/2014 Document Reviewed: 04/28/2013 Elsevier Interactive Patient Education 2016 [employer]   Take antibiotic as directed. Follow-up with your obstetrician if not improving. Return to the emergency room for any concerns.

## 2015-12-21 NOTE — ED Notes (Signed)
Pt discharged to home.  Family member driving.  Discharge instructions reviewed.  Verbalized understanding.  No questions or concerns at this time.  Teach back verified.  Pt in NAD.  No items left in ED.   

## 2016-02-10 ENCOUNTER — Encounter: Payer: Self-pay | Admitting: *Deleted

## 2016-02-10 ENCOUNTER — Inpatient Hospital Stay
Admission: EM | Admit: 2016-02-10 | Discharge: 2016-02-10 | Disposition: A | Discharge status: Home / Self Care | Payer: Medicaid Other | Attending: Obstetrics & Gynecology | Admitting: Obstetrics & Gynecology

## 2016-02-10 DIAGNOSIS — O2343 Unspecified infection of urinary tract in pregnancy, third trimester: Secondary | ICD-10-CM | POA: Insufficient documentation

## 2016-02-10 DIAGNOSIS — Z3A31 31 weeks gestation of pregnancy: Secondary | ICD-10-CM | POA: Insufficient documentation

## 2016-02-10 DIAGNOSIS — N390 Urinary tract infection, site not specified: Secondary | ICD-10-CM | POA: Diagnosis present

## 2016-02-10 LAB — URINALYSIS COMPLETE WITH MICROSCOPIC (ARMC ONLY)
Bilirubin Urine: NEGATIVE
GLUCOSE, UA: 50 mg/dL — AB
Hgb urine dipstick: NEGATIVE
NITRITE: NEGATIVE
Protein, ur: NEGATIVE mg/dL
SPECIFIC GRAVITY, URINE: 1.023 (ref 1.005–1.030)
pH: 6 (ref 5.0–8.0)

## 2016-02-10 MED ORDER — NITROFURANTOIN MONOHYD MACRO 100 MG PO CAPS: 100.0000 mg | ORAL_CAPSULE | Freq: Two times a day (BID) | ORAL | Status: DC

## 2016-02-10 MED ORDER — NITROFURANTOIN MONOHYD MACRO 100 MG PO CAPS
100.0000 mg | ORAL_CAPSULE | Freq: Two times a day (BID) | ORAL | Status: DC
  Administered 2016-02-10: 100 mg via ORAL
  Filled 2016-02-10: qty 1

## 2016-02-10 NOTE — Discharge Summary (Signed)
Patient discharged home, discharge instructions given, patient states understanding. Patient left floor in stable condition, denies any other needs at this time. Patient to keep next scheduled OB appointment or call before if needed

## 2016-02-10 NOTE — Discharge Instructions (Signed)

## 2016-02-10 NOTE — OB Triage Note (Signed)
patient states she started having lower abdominal and back pressure starting around 0000. Denies any other problems  Including no LOF, bleeding, or abnormal discharge. Pt states baby has been moving well today.

## 2016-02-10 NOTE — Final Progress Note (Signed)
Physician Final Progress Note  Patient ID: Audrie Gallusamesha M Happel MRN: [id number] DOB/AGE: 23/01/1991 24 y.o.  Admit date: 02/10/2016 Admitting provider: Nadara Mustardobert P Truett Mcfarlan, MD Discharge date: 02/10/2016   Admission Diagnoses: Suprapubic pain and UTI  Discharge Diagnoses:  Principal Problem:   UTI (lower urinary tract infection)   Consults: None  Significant Findings/ Diagnostic Studies: Pt presents at [redacted] weeks EGA, prenatal care [facility], with few hour h/o suprapubic pain and more long standing low back pain, no nausea, vomiting, diarrhea, ctxs, VB, ROM.  Good FM.  No compl w PNC. Exam- AF, VSS   Chest clear   Heart reg   Abd gravid, NT ND FHTs 130s   SVE deferred   Extr- no edema  Procedures: A NST procedure was performed with FHR monitoring and a normal baseline established, appropriate time of 20-40 minutes of evaluation, and accels >2 seen w 15x15 characteristics.  Results show a REACTIVE NST.   Discharge Condition: good  Disposition: 01-Home or Self Care  Diet: Regular diet  Discharge Activity: Activity as tolerated     Medication List    STOP taking these medications        ibuprofen 800 MG tablet  Commonly known as:  ADVIL,MOTRIN     loratadine 10 MG tablet  Commonly known as:  CLARITIN     metroNIDAZOLE 500 MG tablet  Commonly known as:  FLAGYL     norgestimate-ethinyl estradiol 0.25-35 MG-MCG tablet  Commonly known as:  ORTHO-CYCLEN,SPRINTEC,PREVIFEM      TAKE these medications        multivitamin-prenatal 27-0.8 MG Tabs tablet  Take 1 tablet by mouth daily at 12 noon.     nitrofurantoin (macrocrystal-monohydrate) 100 MG capsule  Commonly known as:  MACROBID  Take 1 capsule (100 mg total) by mouth 2 (two) times daily.           Follow-up Information    Schedule an appointment as soon as possible for a [facility] with [facility].   Why:  As needed   Contact information:   [address] [place] [id number] [phone]        Total time spent taking care of this patient: 15 minutes  Signed: Letitia Libraobert Paul Chana Lindstrom 02/10/2016, 4:39 AM

## 2016-08-01 ENCOUNTER — Emergency Department
Admission: EM | Admit: 2016-08-01 | Discharge: 2016-08-01 | Disposition: A | Discharge status: Home / Self Care | Payer: Medicaid Other | Attending: Emergency Medicine | Admitting: Emergency Medicine

## 2016-08-01 ENCOUNTER — Encounter: Payer: Self-pay | Admitting: *Deleted

## 2016-08-01 DIAGNOSIS — B349 Viral infection, unspecified: Secondary | ICD-10-CM | POA: Insufficient documentation

## 2016-08-01 DIAGNOSIS — J069 Acute upper respiratory infection, unspecified: Secondary | ICD-10-CM

## 2016-08-01 DIAGNOSIS — B9789 Other viral agents as the cause of diseases classified elsewhere: Secondary | ICD-10-CM

## 2016-08-01 MED ORDER — IBUPROFEN 600 MG PO TABS
600.0000 mg | ORAL_TABLET | Freq: Once | ORAL | Status: AC
  Administered 2016-08-01: 600 mg via ORAL
  Filled 2016-08-01: qty 1

## 2016-08-01 MED ORDER — ONDANSETRON 4 MG PO TBDP
4.0000 mg | ORAL_TABLET | Freq: Once | ORAL | Status: AC
  Administered 2016-08-01: 4 mg via ORAL
  Filled 2016-08-01: qty 1

## 2016-08-01 MED ORDER — PROMETHAZINE-DM 6.25-15 MG/5ML PO SYRP: 5.0000 mL | ORAL_SOLUTION | Freq: Four times a day (QID) | ORAL | 0 refills | Status: DC | PRN

## 2016-08-01 MED ORDER — IBUPROFEN 50 MG PO CHEW: 50.0000 mg | CHEWABLE_TABLET | Freq: Three times a day (TID) | ORAL | 2 refills | Status: AC | PRN

## 2016-08-01 MED ORDER — IBUPROFEN 600 MG PO TABS: 600.0000 mg | ORAL_TABLET | Freq: Three times a day (TID) | ORAL | 0 refills | Status: DC | PRN

## 2016-08-01 MED ORDER — BENZONATATE 100 MG PO CAPS
200.0000 mg | ORAL_CAPSULE | Freq: Once | ORAL | Status: AC
  Administered 2016-08-01: 200 mg via ORAL
  Filled 2016-08-01: qty 2

## 2016-08-01 NOTE — ED Notes (Signed)
States she developed a virus about 1 week ago  Had some n/v/d  Last time vomited was several days ago  But now has nasal congestion with some cough  "just feels weak"  Afebrile on arrival but feels dehydrated

## 2016-08-01 NOTE — ED Provider Notes (Signed)
[facility] Emergency Department Provider Note   ____________________________________________   None    (approximate)  I have reviewed the triage vital signs and the nursing notes.   HISTORY  Chief Complaint Cough and Nasal Congestion    HPI Marilyn Rojas is a 25 y.o. female patient complaining of cough and congestion for 3 days. Patient also complaining of body aches and pain. States last week she had nausea and vomiting which has resolved. Patient stated this a frontal headache. Patient denies any vision disturbances or vertigo. Patient states she feels dehydrated.   History reviewed. No pertinent past medical history.  Patient Active Problem List   Diagnosis Date Noted  . UTI (lower urinary tract infection) 02/10/2016    History reviewed. No pertinent surgical history.  Prior to Admission medications   Medication Sig Start Date End Date Taking? Authorizing Provider  norethindrone (MICRONOR,CAMILA,ERRIN) 0.35 MG tablet Take 1 tablet by mouth daily. 04/23/16 04/23/17 Yes Historical Provider, MD  ibuprofen (ADVIL,MOTRIN) 50 MG chewable tablet Chew 1 tablet (50 mg total) by mouth every 8 (eight) hours as needed for fever. 08/01/16 08/01/17  Joni Reiningonald K Smith, PA-C  promethazine-dextromethorphan (PROMETHAZINE-DM) 6.25-15 MG/5ML syrup Take 5 mLs by mouth 4 (four) times daily as needed for cough. 08/01/16   Joni Reiningonald K Smith, PA-C    Allergies Amoxicillin  No family history on file.  Social History Social History  Substance Use Topics  . Smoking status: Never Smoker  . Smokeless tobacco: Never Used  . Alcohol use No    Review of Systems Constitutional: No fever/chills Eyes: No visual changes. ENT: No sore throat. Cardiovascular: Denies chest pain. Respiratory: Denies shortness of breath. Nonproductive cough and chest congestion. Gastrointestinal: No abdominal pain.  No nausea, no vomiting.  No diarrhea.  No constipation. Genitourinary: Negative  for dysuria. Musculoskeletal: Negative for back pain. Skin: Negative for rash. Neurological: Negative for headaches, focal weakness or numbness.    ____________________________________________   PHYSICAL EXAM:  VITAL SIGNS: ED Triage Vitals  Enc Vitals Group     BP 08/01/16 1355 119/77     Pulse Rate 08/01/16 1355 (!) 104     Resp 08/01/16 1355 16     Temp 08/01/16 1355 98.9 F (37.2 C)     Temp Source 08/01/16 1355 Oral     SpO2 08/01/16 1355 99 %     Weight 08/01/16 1346 124 lb (56.2 kg)     Height 08/01/16 1346 5\' 4"  (1.626 m)     Head Circumference --      Peak Flow --      Pain Score --      Pain Loc --      Pain Edu? --      Excl. in GC? --     Constitutional: Alert and oriented. Well appearing and in no acute distress. Eyes: Conjunctivae are normal. PERRL. EOMI. Head: Atraumatic. Nose: Edematous nasal turbinates bilateral maxillary guarding.  Mouth/Throat: Mucous membranes are moist.  Oropharynx non-erythematous. Postnasal drainage Neck: No stridor.  No cervical spine tenderness to palpation. Hematological/Lymphatic/Immunilogical: No cervical lymphadenopathy. Cardiovascular: Normal rate, regular rhythm. Grossly normal heart sounds.  Good peripheral circulation. Respiratory: Normal respiratory effort.  No retractions. Lungs CTAB. Gastrointestinal: Soft and nontender. No distention. No abdominal bruits. No CVA tenderness. Musculoskeletal: No lower extremity tenderness nor edema.  No joint effusions. Neurologic:  Normal speech and language. No gross focal neurologic deficits are appreciated. No gait instability. Skin:  Skin is warm, dry and intact. No rash noted.  Psychiatric: Mood and affect are normal. Speech and behavior are normal.  ____________________________________________   LABS (all labs ordered are listed, but only abnormal results are displayed)  Labs Reviewed - No data to  display ____________________________________________  EKG   ____________________________________________  RADIOLOGY   ____________________________________________   PROCEDURES  Procedure(s) performed: None  Procedures  Critical Care performed: No  ____________________________________________   INITIAL IMPRESSION / ASSESSMENT AND PLAN / ED COURSE  Pertinent labs & imaging results that were available during my care of the patient were reviewed by me and considered in my medical decision making (see chart for details).  Viral illness. Patient given discharge care instructions. Patient given a prescription for promethazine DM and ibuprofen. Patient advised follow-up family doctor condition persists.  Clinical Course     ____________________________________________   FINAL CLINICAL IMPRESSION(S) / ED DIAGNOSES  Final diagnoses:  Viral URI with cough      NEW MEDICATIONS STARTED DURING THIS VISIT:  New Prescriptions   IBUPROFEN (ADVIL,MOTRIN) 50 MG CHEWABLE TABLET    Chew 1 tablet (50 mg total) by mouth every 8 (eight) hours as needed for fever.   PROMETHAZINE-DEXTROMETHORPHAN (PROMETHAZINE-DM) 6.25-15 MG/5ML SYRUP    Take 5 mLs by mouth 4 (four) times daily as needed for cough.     Note:  This document was prepared using Dragon voice recognition software and may include unintentional dictation errors.    Joni Reiningonald K Smith, PA-C 08/01/16 1501    Joni Reiningonald K Smith, PA-C 08/01/16 1502    Sharman CheekPhillip Stafford, MD 08/02/16 [phone]

## 2016-08-01 NOTE — ED Triage Notes (Signed)
Pt reports having cough and  Congestion for the past three days , pt reports generalized aches

## 2016-09-05 ENCOUNTER — Emergency Department
Admission: EM | Admit: 2016-09-05 | Discharge: 2016-09-05 | Disposition: A | Discharge status: Home / Self Care | Payer: Medicaid Other | Attending: Emergency Medicine | Admitting: Emergency Medicine

## 2016-09-05 ENCOUNTER — Encounter: Payer: Self-pay | Admitting: Emergency Medicine

## 2016-09-05 DIAGNOSIS — Z79899 Other long term (current) drug therapy: Secondary | ICD-10-CM | POA: Insufficient documentation

## 2016-09-05 DIAGNOSIS — R1032 Left lower quadrant pain: Secondary | ICD-10-CM | POA: Insufficient documentation

## 2016-09-05 DIAGNOSIS — R1031 Right lower quadrant pain: Secondary | ICD-10-CM | POA: Insufficient documentation

## 2016-09-05 DIAGNOSIS — R1030 Lower abdominal pain, unspecified: Secondary | ICD-10-CM

## 2016-09-05 LAB — CBC WITH DIFFERENTIAL/PLATELET
BASOS ABS: 0.1 10*3/uL (ref 0–0.1)
Basophils Relative: 1 %
EOS ABS: 0.1 10*3/uL (ref 0–0.7)
Eosinophils Relative: 1 %
HCT: 39 % (ref 35.0–47.0)
HEMOGLOBIN: 12.9 g/dL (ref 12.0–16.0)
LYMPHS ABS: 0.9 10*3/uL — AB (ref 1.0–3.6)
LYMPHS PCT: 6 %
MCH: 27.4 pg (ref 26.0–34.0)
MCHC: 33.2 g/dL (ref 32.0–36.0)
MCV: 82.5 fL (ref 80.0–100.0)
Monocytes Absolute: 0.6 10*3/uL (ref 0.2–0.9)
Monocytes Relative: 4 %
NEUTROS PCT: 88 %
Neutro Abs: 12.8 10*3/uL — ABNORMAL HIGH (ref 1.4–6.5)
Platelets: 205 10*3/uL (ref 150–440)
RBC: 4.73 MIL/uL (ref 3.80–5.20)
RDW: 14 % (ref 11.5–14.5)
WBC: 14.5 10*3/uL — AB (ref 3.6–11.0)

## 2016-09-05 LAB — URINALYSIS, COMPLETE (UACMP) WITH MICROSCOPIC
BILIRUBIN URINE: NEGATIVE
GLUCOSE, UA: NEGATIVE mg/dL
HGB URINE DIPSTICK: NEGATIVE
KETONES UR: 5 mg/dL — AB
Nitrite: NEGATIVE
PROTEIN: NEGATIVE mg/dL
Specific Gravity, Urine: 1.024 (ref 1.005–1.030)
pH: 5 (ref 5.0–8.0)

## 2016-09-05 LAB — COMPREHENSIVE METABOLIC PANEL
ALT: 11 U/L — ABNORMAL LOW (ref 14–54)
ANION GAP: 6 (ref 5–15)
AST: 20 U/L (ref 15–41)
Albumin: 4.2 g/dL (ref 3.5–5.0)
Alkaline Phosphatase: 65 U/L (ref 38–126)
BUN: 8 mg/dL (ref 6–20)
CALCIUM: 9.2 mg/dL (ref 8.9–10.3)
CHLORIDE: 106 mmol/L (ref 101–111)
CO2: 24 mmol/L (ref 22–32)
Creatinine, Ser: 0.8 mg/dL (ref 0.44–1.00)
GFR calc non Af Amer: >60 mL/min (ref >60)
Glucose, Bld: 101 mg/dL — ABNORMAL HIGH (ref 65–99)
Potassium: 3.4 mmol/L — ABNORMAL LOW (ref 3.5–5.1)
SODIUM: 136 mmol/L (ref 135–145)
Total Bilirubin: 0.4 mg/dL (ref 0.3–1.2)
Total Protein: 7.3 g/dL (ref 6.5–8.1)

## 2016-09-05 LAB — LIPASE, BLOOD: Lipase: 26 U/L (ref 11–51)

## 2016-09-05 LAB — PREGNANCY, URINE: Preg Test, Ur: NEGATIVE

## 2016-09-05 MED ORDER — ONDANSETRON 4 MG PO TBDP
8.0000 mg | ORAL_TABLET | Freq: Once | ORAL | Status: AC
  Administered 2016-09-05: 8 mg via ORAL
  Filled 2016-09-05: qty 2

## 2016-09-05 MED ORDER — FAMOTIDINE 20 MG PO TABS: 20.0000 mg | ORAL_TABLET | Freq: Two times a day (BID) | ORAL | 0 refills | Status: DC

## 2016-09-05 MED ORDER — ONDANSETRON 4 MG PO TBDP: 4.0000 mg | ORAL_TABLET | Freq: Three times a day (TID) | ORAL | 0 refills | Status: AC | PRN

## 2016-09-05 NOTE — ED Provider Notes (Signed)
[facility] Emergency Department Provider Note  ____________________________________________  Time seen: Approximately 6:13 AM  I have reviewed the triage vital signs and the nursing notes.   HISTORY  Chief Complaint Abdominal Pain    HPI Marilyn Rojas is a 25 y.o. female patient complains of bilateral lower abdominal pain for the past week, no aggravating or alleviating factors, nonradiating. Denies vaginal bleeding or discharge. No dysuria frequency or urgency. Reports nausea but no vomiting. No diarrhea, no constipation. Last bowel movement was yesterday. Reports she hadn't been able to eat for 2 days except that today she can eat just fine.     History reviewed. No pertinent past medical history.   Patient Active Problem List   Diagnosis Date Noted  . UTI (lower urinary tract infection) 02/10/2016     History reviewed. No pertinent surgical history.   Prior to Admission medications   Medication Sig Start Date End Date Taking? Authorizing Provider  famotidine (PEPCID) 20 MG tablet Take 1 tablet (20 mg total) by mouth 2 (two) times daily. 09/05/16   Sharman CheekPhillip Niang [place], MD  ibuprofen (ADVIL,MOTRIN) 50 MG chewable tablet Chew 1 tablet (50 mg total) by mouth every 8 (eight) hours as needed for fever. 08/01/16 08/01/17  Joni Reiningonald K Smith, PA-C  ibuprofen (ADVIL,MOTRIN) 600 MG tablet Take 1 tablet (600 mg total) by mouth every 8 (eight) hours as needed. 08/01/16   Joni Reiningonald K Smith, PA-C  norethindrone (MICRONOR,CAMILA,ERRIN) 0.35 MG tablet Take 1 tablet by mouth daily. 04/23/16 04/23/17  Historical Provider, MD  ondansetron (ZOFRAN ODT) 4 MG disintegrating tablet Take 1 tablet (4 mg total) by mouth every 8 (eight) hours as needed for nausea or vomiting. 09/05/16   Sharman CheekPhillip Jhordan Mckibben, MD  promethazine-dextromethorphan (PROMETHAZINE-DM) 6.25-15 MG/5ML syrup Take 5 mLs by mouth 4 (four) times daily as needed for cough. 08/01/16   Joni Reiningonald K Smith, PA-C   promethazine-dextromethorphan (PROMETHAZINE-DM) 6.25-15 MG/5ML syrup Take 5 mLs by mouth 4 (four) times daily as needed for cough. 08/01/16   Joni Reiningonald K Smith, PA-C     Allergies Amoxicillin   No family history on file.  Social History Social History  Substance Use Topics  . Smoking status: Never Smoker  . Smokeless tobacco: Never Used  . Alcohol use No    Review of Systems  Constitutional:   No fever or chills.  ENT:   No sore throat. No rhinorrhea. Cardiovascular:   No chest pain. Respiratory:   No dyspnea or cough. Gastrointestinal:   Positive abdominal pain as above without vomiting or diarrhea.  Genitourinary:   Negative for dysuria or difficulty urinating. Musculoskeletal:   Negative for focal pain or swelling Neurological:   Negative for headaches 10-point ROS otherwise negative.  ____________________________________________   PHYSICAL EXAM:  VITAL SIGNS: ED Triage Vitals  Enc Vitals Group     BP 09/05/16 0547 (!) 99/57     Pulse Rate 09/05/16 0547 78     Resp 09/05/16 0547 18     Temp 09/05/16 0547 98.5 F (36.9 C)     Temp Source 09/05/16 0547 Oral     SpO2 09/05/16 0547 98 %     Weight 09/05/16 0545 124 lb (56.2 kg)     Height 09/05/16 0545 5\' 4"  (1.626 m)     Head Circumference --      Peak Flow --      Pain Score 09/05/16 0545 9     Pain Loc --      Pain Edu? --  Excl. in GC? --     Vital signs reviewed, nursing assessments reviewed.   Constitutional:   Alert and oriented. Well appearing and in no distress. Eyes:   No scleral icterus. No conjunctival pallor. PERRL. EOMI.  No nystagmus. ENT   Head:   Normocephalic and atraumatic.   Nose:   No congestion/rhinnorhea. No septal hematoma   Mouth/Throat:   MMM, no pharyngeal erythema. No peritonsillar mass.    Neck:   No stridor. No SubQ emphysema. No meningismus. Hematological/Lymphatic/Immunilogical:   No cervical lymphadenopathy. Cardiovascular:   RRR. Symmetric bilateral  radial and DP pulses.  No murmurs.  Respiratory:   Normal respiratory effort without tachypnea nor retractions. Breath sounds are clear and equal bilaterally. No wheezes/rales/rhonchi. Gastrointestinal:   Soft With bilateral lower quadrant tenderness, worse in the suprapubic area. Non distended. There is no CVA tenderness.  No rebound, rigidity, or guarding. Genitourinary:   deferred Musculoskeletal:   Nontender with normal range of motion in all extremities. No joint effusions.  No lower extremity tenderness.  No edema. Neurologic:   Normal speech and language.  CN 2-10 normal. Motor grossly intact. No gross focal neurologic deficits are appreciated.  Skin:    Skin is warm, dry and intact. No rash noted.  No petechiae, purpura, or bullae.  ____________________________________________    LABS (pertinent positives/negatives) (all labs ordered are listed, but only abnormal results are displayed) Labs Reviewed  COMPREHENSIVE METABOLIC PANEL - Abnormal; Notable for the following:       Result Value   Potassium 3.4 (*)    Glucose, Bld 101 (*)    ALT 11 (*)    All other components within normal limits  CBC WITH DIFFERENTIAL/PLATELET - Abnormal; Notable for the following:    WBC 14.5 (*)    Neutro Abs 12.8 (*)    Lymphs Abs 0.9 (*)    All other components within normal limits  URINALYSIS, COMPLETE (UACMP) WITH MICROSCOPIC - Abnormal; Notable for the following:    Color, Urine YELLOW (*)    APPearance HAZY (*)    Ketones, ur 5 (*)    Leukocytes, UA SMALL (*)    Bacteria, UA RARE (*)    Squamous Epithelial / LPF 6-30 (*)    All other components within normal limits  LIPASE, BLOOD  PREGNANCY, URINE   ____________________________________________   EKG    ____________________________________________    RADIOLOGY    ____________________________________________   PROCEDURES Procedures  ____________________________________________   INITIAL IMPRESSION / ASSESSMENT AND  PLAN / ED COURSE  Pertinent labs & imaging results that were available during my care of the patient were reviewed by me and considered in my medical decision making (see chart for details).  Patient presents with abdominal pain, low suspicion for appendicitis torsion PAD or ectopic pregnancy. No [place] symptoms that the patient can report. Vital signs are normal. We'll screen with labs and urinalysis. Zofran for her nausea.  Considering the patient's symptoms, medical history, and physical examination today, I have low suspicion for cholecystitis or biliary pathology, pancreatitis, perforation or bowel obstruction, hernia, intra-abdominal abscess, AAA or dissection, volvulus or intussusception, mesenteric ischemia, or appendicitis.     ----------------------------------------- 7:22 AM on 09/05/2016 -----------------------------------------  Workup negative. We'll discharge home. Follow up primary care.  Zofran.    Clinical Course    ____________________________________________   FINAL CLINICAL IMPRESSION(S) / ED DIAGNOSES  Final diagnoses:  Lower abdominal pain       Portions of this note were generated with dragon dictation software. Dictation  errors may occur despite best attempts at proofreading.    Sharman Cheek, MD 09/05/16 [phone]

## 2016-09-05 NOTE — Discharge Instructions (Addendum)
Your blood and urine tests today were unremarkable. Follow up with primary care for continued monitoring of your symptoms. Take nausea medicine as needed and drink plenty of fluids.

## 2016-09-05 NOTE — ED Provider Notes (Addendum)
Patient seen by Dr. Scotty CourtStafford earlier this morning evaluation for lower abdominal pain. Laboratory evaluation shows evidence of mild leukocytosis but otherwise reassuring. Urinalysis negative. Pregnancy negative. Went to evaluate patient does have mild tenderness and discomfort in the suprapubic region as well as right lower quadrant that she states is been going on for 4 days. No fevers. I expressed my concern for possible appendicitis recommended CT imaging for evaluation of appendicitis versus colitis or diverticulitis. Do recognize that her symptoms and presentation is atypical and she does not have any peritoneal signs at this time. The patient declined CT imaging and demonstrates understanding of the risks of associated with failure to diagnose processes such as appendicitis including death worsening of condition and pain.  The patient does agree to return in 12-24 hours for repeat abdominal exam and demonstrates understanding of signs and symptoms for which she should return immediately to the hospital.   Willy EddyPatrick Mckyla Deckman, MD 09/05/16 [id number]    Willy EddyPatrick Mujtaba Bollig, MD 09/05/16 [phone]

## 2016-09-05 NOTE — ED Notes (Addendum)
Pt refuses PO challenge.

## 2016-09-05 NOTE — ED Notes (Signed)
Pt comes in c/o abd pain for the last week. Also reports constipation but when asked when her last BM was, pt reports that she had a large BM last night and the one prior to that was yesterday morning. Pt reports that she has been nauseated but denies vomiting, denies diarrhea. Pt denies any urinary symptoms, denies abnormal vaginal bleeding, when asked about vaginal discharge pt stated, "well, nothing abnormal." Pt's abdomen diffusely tender to palpation, but pt reports lower quadrants are most painful.

## 2016-09-05 NOTE — ED Triage Notes (Signed)
Patient ambulatory to triage with steady gait, without difficulty or distress noted; pt reports lower abd pain x week accomp by constipation

## 2017-02-20 ENCOUNTER — Emergency Department: Payer: Self-pay

## 2017-02-20 ENCOUNTER — Emergency Department
Admission: EM | Admit: 2017-02-20 | Discharge: 2017-02-20 | Disposition: A | Discharge status: Home / Self Care | Payer: Self-pay | Attending: Emergency Medicine | Admitting: Emergency Medicine

## 2017-02-20 ENCOUNTER — Encounter: Payer: Self-pay | Admitting: Emergency Medicine

## 2017-02-20 DIAGNOSIS — Z79899 Other long term (current) drug therapy: Secondary | ICD-10-CM | POA: Insufficient documentation

## 2017-02-20 DIAGNOSIS — R1030 Lower abdominal pain, unspecified: Secondary | ICD-10-CM | POA: Insufficient documentation

## 2017-02-20 HISTORY — DX: Noninfective gastroenteritis and colitis, unspecified: K52.9

## 2017-02-20 HISTORY — DX: Unspecified ovarian cyst, unspecified side: N83.209

## 2017-02-20 LAB — COMPREHENSIVE METABOLIC PANEL
ALT: 12 U/L — AB (ref 14–54)
AST: 21 U/L (ref 15–41)
Albumin: 4.3 g/dL (ref 3.5–5.0)
Alkaline Phosphatase: 51 U/L (ref 38–126)
Anion gap: 7 (ref 5–15)
BUN: 10 mg/dL (ref 6–20)
CHLORIDE: 104 mmol/L (ref 101–111)
CO2: 25 mmol/L (ref 22–32)
CREATININE: 0.8 mg/dL (ref 0.44–1.00)
Calcium: 9.3 mg/dL (ref 8.9–10.3)
GFR calc Af Amer: >60 mL/min (ref >60)
GFR calc non Af Amer: >60 mL/min (ref >60)
Glucose, Bld: 84 mg/dL (ref 65–99)
Potassium: 3.7 mmol/L (ref 3.5–5.1)
SODIUM: 136 mmol/L (ref 135–145)
Total Bilirubin: 0.8 mg/dL (ref 0.3–1.2)
Total Protein: 7.4 g/dL (ref 6.5–8.1)

## 2017-02-20 LAB — URINALYSIS, COMPLETE (UACMP) WITH MICROSCOPIC
Bilirubin Urine: NEGATIVE
Glucose, UA: NEGATIVE mg/dL
HGB URINE DIPSTICK: NEGATIVE
Ketones, ur: 80 mg/dL — AB
LEUKOCYTES UA: NEGATIVE
Nitrite: NEGATIVE
PH: 5 (ref 5.0–8.0)
Protein, ur: 30 mg/dL — AB
SPECIFIC GRAVITY, URINE: 1.024 (ref 1.005–1.030)

## 2017-02-20 LAB — CBC
HCT: 38.2 % (ref 35.0–47.0)
Hemoglobin: 12.7 g/dL (ref 12.0–16.0)
MCH: 28.1 pg (ref 26.0–34.0)
MCHC: 33.4 g/dL (ref 32.0–36.0)
MCV: 84.2 fL (ref 80.0–100.0)
PLATELETS: 224 10*3/uL (ref 150–440)
RBC: 4.53 MIL/uL (ref 3.80–5.20)
RDW: 13.6 % (ref 11.5–14.5)
WBC: 6.5 10*3/uL (ref 3.6–11.0)

## 2017-02-20 LAB — POCT PREGNANCY, URINE: PREG TEST UR: NEGATIVE

## 2017-02-20 LAB — LIPASE, BLOOD: LIPASE: 19 U/L (ref 11–51)

## 2017-02-20 LAB — PREGNANCY, URINE: PREG TEST UR: NEGATIVE

## 2017-02-20 MED ORDER — MORPHINE SULFATE (PF) 4 MG/ML IV SOLN
4.0000 mg | Freq: Once | INTRAVENOUS | Status: AC
  Administered 2017-02-20: 4 mg via INTRAVENOUS
  Filled 2017-02-20: qty 1

## 2017-02-20 MED ORDER — MORPHINE SULFATE (PF) 4 MG/ML IV SOLN
4.0000 mg | Freq: Once | INTRAVENOUS | Status: AC
  Administered 2017-02-20: 4 mg via INTRAVENOUS

## 2017-02-20 MED ORDER — PROMETHAZINE HCL 12.5 MG PO TABS: 12.5000 mg | ORAL_TABLET | Freq: Four times a day (QID) | ORAL | 0 refills | Status: AC | PRN

## 2017-02-20 MED ORDER — ONDANSETRON 4 MG PO TBDP: 4.0000 mg | ORAL_TABLET | Freq: Once | ORAL | Status: DC | PRN

## 2017-02-20 MED ORDER — SODIUM CHLORIDE 0.9 % IV SOLN
Freq: Once | INTRAVENOUS | Status: AC
  Administered 2017-02-20: 09:00:00 via INTRAVENOUS

## 2017-02-20 MED ORDER — IOPAMIDOL (ISOVUE-300) INJECTION 61%
75.0000 mL | Freq: Once | INTRAVENOUS | Status: AC | PRN
  Administered 2017-02-20: 75 mL via INTRAVENOUS

## 2017-02-20 MED ORDER — MORPHINE SULFATE (PF) 4 MG/ML IV SOLN
INTRAVENOUS | Status: AC
  Administered 2017-02-20: 4 mg via INTRAVENOUS
  Filled 2017-02-20: qty 1

## 2017-02-20 MED ORDER — IOPAMIDOL (ISOVUE-300) INJECTION 61%
15.0000 mL | INTRAVENOUS | Status: AC
  Administered 2017-02-20 (×2): 15 mL via ORAL

## 2017-02-20 MED ORDER — PROMETHAZINE HCL 25 MG/ML IJ SOLN
25.0000 mg | Freq: Once | INTRAMUSCULAR | Status: AC
  Administered 2017-02-20: 25 mg via INTRAVENOUS
  Filled 2017-02-20: qty 1

## 2017-02-20 NOTE — Discharge Instructions (Signed)
Use the Phenergan 1 pill 4 times a day if needed for nausea. Please return for worse pain fever unable to keep down fluids. [facility] or [facility] [place] [facility]

## 2017-02-20 NOTE — ED Notes (Signed)
Report given to Kate, RN

## 2017-02-20 NOTE — ED Notes (Signed)
Patient vomited prior to Morphine dose. Dr. Darnelle CatalanMalinda notified. Nausea medication on hold d/t patient refusing Zofran d/t constipation, as well as large dose of Phenergan given prior to drinking contrast. Patient instructed to stop drinking CT contrast, as advised by Dr. Darnelle CatalanMalinda. CT notified to scan patient without oral contrast per Dr. Darnelle CatalanMalinda.

## 2017-02-20 NOTE — ED Provider Notes (Addendum)
[facility] Emergency Department Provider Note   ____________________________________________   First MD Initiated Contact with Patient 02/20/17 [phone]     (approximate)  I have reviewed the triage vital signs and the nursing notes.   HISTORY  Chief Complaint Abdominal Pain   HPI Marilyn Rojas is a 26 y.o. female 3 days of crampy abdominal pain initially there was diarrhea but there is none now. Patient also has some nausea. She reports she's not been eating or drinking much. Diarrhea has since stopped but the crampy abdominal pain continues. Patient is been seen repeatedly for lower abdominal pain since delivering her baby. Patient had a diagnosis of colitis at [facility] in her last visit to the ER there. A note from the CT scan said that there was colonic wall thickening and enhancement with pericolic stranding however the CT report also had an attending note which said that he felt that the wall thickening was due to under distention and that there was no colitis on not exactly sure what was going on there.   Past Medical History:  Diagnosis Date  . Colitis   . Ovarian cyst     Patient Active Problem List   Diagnosis Date Noted  . UTI (lower urinary tract infection) 02/10/2016    History reviewed. No pertinent surgical history.  Prior to Admission medications   Medication Sig Start Date End Date Taking? Authorizing Provider  norethindrone-ethinyl estradiol (CYCLAFEM,ALYACEN) 0.5/0.75/1-35 MG-MCG tablet Take 1 tablet by mouth daily.   Yes [provider]  famotidine (PEPCID) 20 MG tablet Take 1 tablet (20 mg total) by mouth 2 (two) times daily. Patient not taking: Reported on 02/20/2017 09/05/16   Sharman Cheek, MD  ibuprofen (ADVIL,MOTRIN) 50 MG chewable tablet Chew 1 tablet (50 mg total) by mouth every 8 (eight) hours as needed for fever. Patient not taking: Reported on 02/20/2017 08/01/16 08/01/17  Joni Reining, PA-C  ibuprofen  (ADVIL,MOTRIN) 600 MG tablet Take 1 tablet (600 mg total) by mouth every 8 (eight) hours as needed. Patient not taking: Reported on 02/20/2017 08/01/16   Joni Reining, PA-C  ondansetron (ZOFRAN ODT) 4 MG disintegrating tablet Take 1 tablet (4 mg total) by mouth every 8 (eight) hours as needed for nausea or vomiting. Patient not taking: Reported on 02/20/2017 09/05/16   Sharman Cheek, MD  promethazine-dextromethorphan (PROMETHAZINE-DM) 6.25-15 MG/5ML syrup Take 5 mLs by mouth 4 (four) times daily as needed for cough. Patient not taking: Reported on 02/20/2017 08/01/16   Joni Reining, PA-C  promethazine-dextromethorphan (PROMETHAZINE-DM) 6.25-15 MG/5ML syrup Take 5 mLs by mouth 4 (four) times daily as needed for cough. Patient not taking: Reported on 02/20/2017 08/01/16   Joni Reining, PA-C    Allergies Amoxicillin  History reviewed. No pertinent family history.  Social History Social History  Substance Use Topics  . Smoking status: Never Smoker  . Smokeless tobacco: Never Used  . Alcohol use No    Review of Systems  Constitutional: No fever/chills Eyes: No visual changes. ENT: No sore throat. Cardiovascular: Denies chest pain. Respiratory: Denies shortness of breath. Gastrointestinal: See history of present illness Genitourinary: Negative for dysuria. Musculoskeletal: Negative for back pain. Skin: Negative for rash. Neurological: Negative for headaches, focal weakness or numbness.   ____________________________________________   PHYSICAL EXAM:  VITAL SIGNS: ED Triage Vitals  Enc Vitals Group     BP 02/20/17 0733 124/77     Pulse Rate 02/20/17 0733 (!) 57     Resp 02/20/17 0733 16  Temp 02/20/17 0733 98.5 F (36.9 C)     Temp Source 02/20/17 0733 Oral     SpO2 02/20/17 0733 99 %     Weight 02/20/17 0722 105 lb (47.6 kg)     Height 02/20/17 0722 5\' 4"  (1.626 m)     Head Circumference --      Peak Flow --      Pain Score 02/20/17 0722 10     Pain Loc --        Pain Edu? --      Excl. in GC? --     Constitutional: Alert and oriented. Well appearing and in no acute distress. Eyes: Conjunctivae are normal. PERRL. EOMI. Head: Atraumatic. Nose: No congestion/rhinnorhea. Mouth/Throat: Mucous membranes are moist.  Oropharynx non-erythematous. Neck: No stridor.   Cardiovascular: Normal rate, regular rhythm. Grossly normal heart sounds.  Good peripheral circulation. Respiratory: Normal respiratory effort.  No retractions. Lungs CTAB. Gastrointestinal: Soft tender especially in the lower abdomen. Tender to palpation and percussion but not severely. Bowel sounds are decreased. No distention. No abdominal bruits. No CVA tenderness. }Musculoskeletal: No lower extremity tenderness nor edema.  No joint effusions. Neurologic:  Normal speech and language. No gross focal neurologic deficits are appreciated. No gait instability. Skin:  Skin is warm, dry and intact. No rash noted. Psychiatric: Mood and affect are normal. Speech and behavior are normal.  ____________________________________________   LABS (all labs ordered are listed, but only abnormal results are displayed)  Labs Reviewed  COMPREHENSIVE METABOLIC PANEL - Abnormal; Notable for the following:       Result Value   ALT 12 (*)    All other components within normal limits  URINALYSIS, COMPLETE (UACMP) WITH MICROSCOPIC - Abnormal; Notable for the following:    Color, Urine YELLOW (*)    APPearance HAZY (*)    Ketones, ur 80 (*)    Protein, ur 30 (*)    Bacteria, UA RARE (*)    Squamous Epithelial / LPF 0-5 (*)    All other components within normal limits  LIPASE, BLOOD  CBC  PREGNANCY, URINE  POC URINE PREG, ED  POCT PREGNANCY, URINE   ____________________________________________  EKG   ____________________________________________  RADIOLOGY  Study Result   CLINICAL DATA:  Crampy abdominal pain.  History of colitis.  EXAM: CT ABDOMEN AND PELVIS WITH  CONTRAST  TECHNIQUE: Multidetector CT imaging of the abdomen and pelvis was performed using the standard protocol following bolus administration of intravenous contrast.  CONTRAST:  75mL ISOVUE-300 IOPAMIDOL (ISOVUE-300) INJECTION 61%  COMPARISON:  None.  FINDINGS: Lower chest: No acute abnormality.  Hepatobiliary: No focal liver abnormality is seen. No gallstones, gallbladder wall thickening, or biliary dilatation.  Pancreas: Unremarkable. No pancreatic ductal dilatation or surrounding inflammatory changes.  Spleen: Normal in size without focal abnormality.  Adrenals/Urinary Tract: The adrenal glands appear normal. Unremarkable appearance of both kidneys.The urinary bladder appears normal.  Stomach/Bowel: Stomach is within normal limits. Appendix appears normal. Mild diffuse wall thickening involving the colon identified which is favored to reflect incomplete distention. No surrounding inflammatory change or free fluid.  Vascular/Lymphatic: No significant vascular findings are present. No enlarged abdominal or pelvic lymph nodes.  Reproductive: Uterus and adnexal structures have a normal physiologic appearance. Corpus luteal cysts noted in the right ovary.  Other: No free fluid or fluid collections.  Musculoskeletal: No aggressive lytic or sclerotic bone lesions.  IMPRESSION: 1. There is mild diffuse wall thickening of the colon which is favored to reflect incomplete distention. Mild uncomplicated  colitis not excluded. No surrounding inflammatory changes identified. 2. Right ovary corpus luteal cyst   Electronically Signed   By: Signa Kell M.D.   On: 02/20/2017 10:47    ____________________________________________   PROCEDURES  Procedure(s) performed:  Procedures  Critical Care performed:   ____________________________________________   INITIAL IMPRESSION / ASSESSMENT AND PLAN / ED COURSE  Pertinent labs & imaging results  that were available during my care of the patient were reviewed by me and considered in my medical decision making (see chart for details).   On discharge patient is feeling well and not vomiting any longer.     ____________________________________________   FINAL CLINICAL IMPRESSION(S) / ED DIAGNOSES  Final diagnoses:  Lower abdominal pain      NEW MEDICATIONS STARTED DURING THIS VISIT:  New Prescriptions   No medications on file     Note:  This document was prepared using Dragon voice recognition software and may include unintentional dictation errors.    Arnaldo Natal, MD 02/20/17 1122    Arnaldo Natal, MD 02/20/17 [phone]

## 2017-08-26 ENCOUNTER — Emergency Department
Admission: EM | Admit: 2017-08-26 | Discharge: 2017-08-26 | Disposition: A | Discharge status: Home / Self Care | Payer: Medicaid Other | Attending: Emergency Medicine | Admitting: Emergency Medicine

## 2017-08-26 ENCOUNTER — Other Ambulatory Visit: Payer: Self-pay

## 2017-08-26 DIAGNOSIS — Z79899 Other long term (current) drug therapy: Secondary | ICD-10-CM | POA: Diagnosis not present

## 2017-08-26 DIAGNOSIS — K2970 Gastritis, unspecified, without bleeding: Secondary | ICD-10-CM | POA: Insufficient documentation

## 2017-08-26 DIAGNOSIS — R1012 Left upper quadrant pain: Secondary | ICD-10-CM | POA: Diagnosis not present

## 2017-08-26 LAB — URINALYSIS, COMPLETE (UACMP) WITH MICROSCOPIC
BACTERIA UA: NONE SEEN
BILIRUBIN URINE: NEGATIVE
Glucose, UA: NEGATIVE mg/dL
HGB URINE DIPSTICK: NEGATIVE
KETONES UR: 20 mg/dL — AB
LEUKOCYTES UA: NEGATIVE
NITRITE: NEGATIVE
PROTEIN: 100 mg/dL — AB
Specific Gravity, Urine: 1.026 (ref 1.005–1.030)
pH: 6 (ref 5.0–8.0)

## 2017-08-26 LAB — COMPREHENSIVE METABOLIC PANEL
ALBUMIN: 4.7 g/dL (ref 3.5–5.0)
ALT: 17 U/L (ref 14–54)
AST: 31 U/L (ref 15–41)
Alkaline Phosphatase: 55 U/L (ref 38–126)
Anion gap: 13 (ref 5–15)
BUN: 11 mg/dL (ref 6–20)
CHLORIDE: 103 mmol/L (ref 101–111)
CO2: 24 mmol/L (ref 22–32)
CREATININE: 0.83 mg/dL (ref 0.44–1.00)
Calcium: 9.8 mg/dL (ref 8.9–10.3)
GFR calc Af Amer: >60 mL/min (ref >60)
GFR calc non Af Amer: >60 mL/min (ref >60)
Glucose, Bld: 85 mg/dL (ref 65–99)
Potassium: 3.3 mmol/L — ABNORMAL LOW (ref 3.5–5.1)
SODIUM: 140 mmol/L (ref 135–145)
Total Bilirubin: 0.7 mg/dL (ref 0.3–1.2)
Total Protein: 7.9 g/dL (ref 6.5–8.1)

## 2017-08-26 LAB — CBC
HCT: 38.1 % (ref 35.0–47.0)
Hemoglobin: 12.6 g/dL (ref 12.0–16.0)
MCH: 28.4 pg (ref 26.0–34.0)
MCHC: 33 g/dL (ref 32.0–36.0)
MCV: 86.2 fL (ref 80.0–100.0)
Platelets: 229 10*3/uL (ref 150–440)
RBC: 4.42 MIL/uL (ref 3.80–5.20)
RDW: 13.5 % (ref 11.5–14.5)
WBC: 10.2 10*3/uL (ref 3.6–11.0)

## 2017-08-26 LAB — POCT PREGNANCY, URINE: PREG TEST UR: NEGATIVE

## 2017-08-26 LAB — LIPASE, BLOOD: LIPASE: 18 U/L (ref 11–51)

## 2017-08-26 MED ORDER — FAMOTIDINE 20 MG PO TABS: 20.0000 mg | ORAL_TABLET | Freq: Two times a day (BID) | ORAL | 0 refills | Status: AC

## 2017-08-26 MED ORDER — FAMOTIDINE 20 MG PO TABS
40.0000 mg | ORAL_TABLET | Freq: Once | ORAL | Status: AC
  Administered 2017-08-26: 40 mg via ORAL
  Filled 2017-08-26: qty 2

## 2017-08-26 MED ORDER — ALUMINUM-MAGNESIUM-SIMETHICONE 200-200-20 MG/5ML PO SUSP: 30.0000 mL | Freq: Three times a day (TID) | ORAL | 0 refills | Status: AC

## 2017-08-26 MED ORDER — POTASSIUM CHLORIDE CRYS ER 20 MEQ PO TBCR
EXTENDED_RELEASE_TABLET | ORAL | Status: AC
  Filled 2017-08-26: qty 2

## 2017-08-26 MED ORDER — POTASSIUM CHLORIDE CRYS ER 20 MEQ PO TBCR
40.0000 meq | EXTENDED_RELEASE_TABLET | Freq: Once | ORAL | Status: AC
  Administered 2017-08-26: 40 meq via ORAL

## 2017-08-26 MED ORDER — METOCLOPRAMIDE HCL 10 MG PO TABS
10.0000 mg | ORAL_TABLET | Freq: Once | ORAL | Status: AC
  Administered 2017-08-26: 10 mg via ORAL
  Filled 2017-08-26: qty 1

## 2017-08-26 MED ORDER — GI COCKTAIL ~~LOC~~
30.0000 mL | ORAL | Status: AC
  Administered 2017-08-26: 30 mL via ORAL
  Filled 2017-08-26: qty 30

## 2017-08-26 MED ORDER — ONDANSETRON 4 MG PO TBDP
ORAL_TABLET | ORAL | Status: AC
  Administered 2017-08-26: 4 mg via ORAL
  Filled 2017-08-26: qty 1

## 2017-08-26 MED ORDER — METOCLOPRAMIDE HCL 10 MG PO TABS: 10.0000 mg | ORAL_TABLET | Freq: Four times a day (QID) | ORAL | 0 refills | Status: AC | PRN

## 2017-08-26 MED ORDER — ONDANSETRON 4 MG PO TBDP
4.0000 mg | ORAL_TABLET | Freq: Once | ORAL | Status: AC
  Administered 2017-08-26: 4 mg via ORAL

## 2017-08-26 NOTE — ED Triage Notes (Signed)
Pt c/o N/V with abd pain since this morning. States she has a hx of colitis and has lost a lot of wt recently. Normal BM this morning..Marland Kitchen

## 2017-08-26 NOTE — ED Triage Notes (Signed)
FIRST NURSE NOTE-c/o abdominal pain and vomiting. Feels like colitis flare. Ambulatory.

## 2017-08-26 NOTE — ED Notes (Signed)
Patient signed on e-signature pad but e-signature pad did not download to computer.  Patient verbalized understanding of discharge instructions.

## 2017-08-26 NOTE — ED Provider Notes (Signed)
[facility] Emergency Department Provider Note  ____________________________________________  Time seen: Approximately 5:11 PM  I have reviewed the triage vital signs and the nursing notes.   HISTORY  Chief Complaint Abdominal Pain    HPI Marilyn Rojas is a 26 y.o. female who complains of upper abdominal pain for the past several months. Worse in the morning when she wakes up, associated with morning vomiting as well as a dry cough. Also has early satiety when she tries to eat and has been having unintentional weight loss as a result. She has been seen in the past for this a few times over the past 9 months, had a few CT scans. She attributes her symptoms to a diagnosis of colitis. No fevers or chills. No bloody vomit. No diarrhea or constipation. Pain is crampy and intermittent. No alleviating factors, nonradiating. Moderate intensity.     Past Medical History:  Diagnosis Date  . Colitis   . Ovarian cyst      Patient Active Problem List   Diagnosis Date Noted  . UTI (lower urinary tract infection) 02/10/2016     History reviewed. No pertinent surgical history.   Prior to Admission medications   Medication Sig Start Date End Date Taking? Authorizing Provider  aluminum-magnesium hydroxide-simethicone (MAALOX) 200-200-20 MG/5ML SUSP Take 30 mLs by mouth 4 (four) times daily -  before meals and at bedtime. 08/26/17   Sharman CheekStafford, Anwar Sakata, MD  famotidine (PEPCID) 20 MG tablet Take 1 tablet (20 mg total) by mouth 2 (two) times daily. 08/26/17   Sharman CheekStafford, Mercades Bajaj, MD  metoCLOPramide (REGLAN) 10 MG tablet Take 1 tablet (10 mg total) by mouth every 6 (six) hours as needed. 08/26/17   Sharman CheekStafford, Valarie Farace, MD  norethindrone-ethinyl estradiol [employer](CYCLAFEM,ALYACEN) 0.5/0.75/1-35 MG-MCG tablet Take 1 tablet by mouth daily.    [provider]  ondansetron (ZOFRAN ODT) 4 MG disintegrating tablet Take 1 tablet (4 mg total) by mouth every 8 (eight) hours as needed  for nausea or vomiting. Patient not taking: Reported on 02/20/2017 09/05/16   Sharman CheekStafford, Kenedie Dirocco, MD  promethazine (PHENERGAN) 12.5 MG tablet Take 1 tablet (12.5 mg total) by mouth every 6 (six) hours as needed for nausea or vomiting. 02/20/17   Arnaldo NatalMalinda, Paul F, MD     Allergies Amoxicillin   No family history on file.  Social History Social History   Tobacco Use  . Smoking status: Never Smoker  . Smokeless tobacco: Never Used  Substance Use Topics  . Alcohol use: No  . Drug use: No    Review of Systems  Constitutional:   No fever or chills.  ENT:   No sore throat. No rhinorrhea. Cardiovascular:   No chest pain or syncope. Respiratory:   No dyspnea or cough. Gastrointestinal:   Positive as above for upper abdominal pain with vomiting..  Musculoskeletal:   Negative for focal pain or swelling All other systems reviewed and are negative except as documented above in ROS and HPI.  ____________________________________________   PHYSICAL EXAM:  VITAL SIGNS: ED Triage Vitals [08/26/17 1301]  Enc Vitals Group     BP 108/66     Pulse Rate 63     Resp 16     Temp 98.3 F (36.8 C)     Temp Source Oral     SpO2 99 %     Weight 98 lb (44.5 kg)     Height 5\' 4"  (1.626 m)     Head Circumference      Peak Flow  Pain Score 7     Pain Loc      Pain Edu?      Excl. in GC?     Vital signs reviewed, nursing assessments reviewed.   Constitutional:   Alert and oriented. Well appearing and in no distress. Eyes:   No scleral icterus.  EOMI. No nystagmus. No conjunctival pallor. PERRL. ENT   Head:   Normocephalic and atraumatic.   Nose:   No congestion/rhinnorhea.    Mouth/Throat:   MMM, no pharyngeal erythema. No peritonsillar mass.    Neck:   No meningismus. Full ROM. Hematological/Lymphatic/Immunilogical:   No cervical lymphadenopathy. Cardiovascular:   RRR. Symmetric bilateral radial and DP pulses.  No murmurs.  Respiratory:   Normal respiratory effort  without tachypnea/retractions. Breath sounds are clear and equal bilaterally. No wheezes/rales/rhonchi. Gastrointestinal:   Soft with mild left upper quadrant tenderness. Non distended. There is no CVA tenderness.  No rebound, rigidity, or guarding. Genitourinary:   deferred Musculoskeletal:   Normal range of motion in all extremities. No joint effusions.  No lower extremity tenderness.  No edema. Neurologic:   Normal speech and language.  Motor grossly intact. No gross focal neurologic deficits are appreciated.  Skin:    Skin is warm, dry and intact. No rash noted.  No petechiae, purpura, or bullae.  ____________________________________________    LABS (pertinent positives/negatives) (all labs ordered are listed, but only abnormal results are displayed) Labs Reviewed  COMPREHENSIVE METABOLIC PANEL - Abnormal; Notable for the following components:      Result Value   Potassium 3.3 (*)    All other components within normal limits  URINALYSIS, COMPLETE (UACMP) WITH MICROSCOPIC - Abnormal; Notable for the following components:   Color, Urine YELLOW (*)    APPearance CLOUDY (*)    Ketones, ur 20 (*)    Protein, ur 100 (*)    Squamous Epithelial / LPF 6-30 (*)    All other components within normal limits  LIPASE, BLOOD  CBC  POC URINE PREG, ED  POCT PREGNANCY, URINE   ____________________________________________   EKG    ____________________________________________    RADIOLOGY  No results found.  ____________________________________________   PROCEDURES Procedures  ____________________________________________   DIFFERENTIAL DIAGNOSIS  GERD, gastritis, pancreatitis, colitis  CLINICAL IMPRESSION / ASSESSMENT AND PLAN / ED COURSE  Pertinent labs & imaging results that were available during my care of the patient were reviewed by me and considered in my medical decision making (see chart for details).   Patient well-appearing no acute distress, normal vital  signs. Review of electronic medical record shows CT scan here in May and CT scan in [facility] in February both actually were read as negative for colitis and likely just due to under distention of the colon causing an abnormal appearance. Based on vital symptoms and exam here I doubt colitis. Labs are entirely negative as well, low suspicion for hepatitis pancreatitis cholecystitis perforation obstruction appendicitis AAA TOA PID or STI. Not pregnant.  Presentation is most consistent with gastritis. Patient feels much better after receiving antacids, tolerating oral intake, ready to go home. I will have her continue antacids at home, follow-up with primary care.      ____________________________________________   FINAL CLINICAL IMPRESSION(S) / ED DIAGNOSES    Final diagnoses:  Left upper quadrant pain  Gastritis without bleeding, unspecified chronicity, unspecified gastritis type      This SmartLink is deprecated. Use AVSMEDLIST instead to display the medication list for a patient.   Portions of this note were  generated with Scientist, clinical (histocompatibility and immunogenetics)dragon dictation software. Dictation errors may occur despite best attempts at proofreading.    Sharman CheekStafford, Taquana Bartley, MD 08/26/17 [phone]

## 2018-05-15 ENCOUNTER — Emergency Department: Payer: Medicaid Other

## 2018-05-15 ENCOUNTER — Encounter: Payer: Self-pay | Admitting: Emergency Medicine

## 2018-05-15 ENCOUNTER — Emergency Department
Admission: EM | Admit: 2018-05-15 | Discharge: 2018-05-15 | Disposition: A | Discharge status: Home / Self Care | Payer: Medicaid Other | Attending: Emergency Medicine | Admitting: Emergency Medicine

## 2018-05-15 ENCOUNTER — Other Ambulatory Visit: Payer: Self-pay

## 2018-05-15 DIAGNOSIS — K5900 Constipation, unspecified: Secondary | ICD-10-CM | POA: Diagnosis not present

## 2018-05-15 DIAGNOSIS — O26891 Other specified pregnancy related conditions, first trimester: Secondary | ICD-10-CM | POA: Insufficient documentation

## 2018-05-15 DIAGNOSIS — R102 Pelvic and perineal pain: Secondary | ICD-10-CM | POA: Diagnosis not present

## 2018-05-15 DIAGNOSIS — Z3A01 Less than 8 weeks gestation of pregnancy: Secondary | ICD-10-CM | POA: Diagnosis not present

## 2018-05-15 DIAGNOSIS — R109 Unspecified abdominal pain: Secondary | ICD-10-CM

## 2018-05-15 LAB — CBC
HCT: 40.8 % (ref 35.0–47.0)
Hemoglobin: 14 g/dL (ref 12.0–16.0)
MCH: 29.3 pg (ref 26.0–34.0)
MCHC: 34.3 g/dL (ref 32.0–36.0)
MCV: 85.6 fL (ref 80.0–100.0)
PLATELETS: 245 10*3/uL (ref 150–440)
RBC: 4.77 MIL/uL (ref 3.80–5.20)
RDW: 13.4 % (ref 11.5–14.5)
WBC: 5.3 10*3/uL (ref 3.6–11.0)

## 2018-05-15 LAB — COMPREHENSIVE METABOLIC PANEL
ALBUMIN: 4.6 g/dL (ref 3.5–5.0)
ALK PHOS: 45 U/L (ref 38–126)
ALT: 10 U/L (ref 0–44)
ANION GAP: 8 (ref 5–15)
AST: 22 U/L (ref 15–41)
BUN: 6 mg/dL (ref 6–20)
CALCIUM: 9.5 mg/dL (ref 8.9–10.3)
CO2: 24 mmol/L (ref 22–32)
Chloride: 104 mmol/L (ref 98–111)
Creatinine, Ser: 0.72 mg/dL (ref 0.44–1.00)
GFR calc non Af Amer: >60 mL/min (ref >60)
Glucose, Bld: 103 mg/dL — ABNORMAL HIGH (ref 70–99)
POTASSIUM: 3.5 mmol/L (ref 3.5–5.1)
SODIUM: 136 mmol/L (ref 135–145)
TOTAL PROTEIN: 7.8 g/dL (ref 6.5–8.1)
Total Bilirubin: 0.8 mg/dL (ref 0.3–1.2)

## 2018-05-15 LAB — URINALYSIS, COMPLETE (UACMP) WITH MICROSCOPIC
BILIRUBIN URINE: NEGATIVE
Bacteria, UA: NONE SEEN
GLUCOSE, UA: NEGATIVE mg/dL
HGB URINE DIPSTICK: NEGATIVE
Ketones, ur: NEGATIVE mg/dL
LEUKOCYTES UA: NEGATIVE
NITRITE: NEGATIVE
PH: 7 (ref 5.0–8.0)
PROTEIN: NEGATIVE mg/dL
Specific Gravity, Urine: 1.011 (ref 1.005–1.030)
WBC, UA: NONE SEEN WBC/hpf (ref 0–5)

## 2018-05-15 LAB — HCG, QUANTITATIVE, PREGNANCY: hCG, Beta Chain, Quant, S: 88567 m[IU]/mL — ABNORMAL HIGH (ref <5)

## 2018-05-15 LAB — POCT PREGNANCY, URINE: PREG TEST UR: POSITIVE — AB

## 2018-05-15 LAB — LIPASE, BLOOD: Lipase: 27 U/L (ref 11–51)

## 2018-05-15 MED ORDER — ACETAMINOPHEN 325 MG PO TABS
650.0000 mg | ORAL_TABLET | Freq: Once | ORAL | Status: AC
  Administered 2018-05-15: 650 mg via ORAL
  Filled 2018-05-15: qty 2

## 2018-05-15 MED ORDER — SODIUM CHLORIDE 0.9 % IV SOLN
1000.0000 mL | Freq: Once | INTRAVENOUS | Status: AC
  Administered 2018-05-15: 1000 mL via INTRAVENOUS

## 2018-05-15 MED ORDER — ONDANSETRON HCL 4 MG/2ML IJ SOLN
4.0000 mg | Freq: Once | INTRAMUSCULAR | Status: AC
  Administered 2018-05-15: 4 mg via INTRAVENOUS
  Filled 2018-05-15: qty 2

## 2018-05-15 NOTE — ED Notes (Signed)

## 2018-05-15 NOTE — ED Provider Notes (Signed)
[facility] Emergency Department Provider Note   ____________________________________________    I have reviewed the triage vital signs and the nursing notes.   HISTORY  Chief Complaint Abdominal Pain     HPI Marilyn Rojas is a 27 y.o. female who presents with complaints of lower abdominal cramping.  Patient reports she is approximately [redacted] weeks pregnant has not seen OB yet.  She is a G3, P1.  She notes over the last several days she has had lower central abdominal/pelvic cramping but it is been mild, worsened this morning.  She denies dysuria.  No vaginal discharge.  No bleeding.  No fevers or chills.  Has had severe morning sickness with this pregnancy.  Feels quite dehydrated.  Past Medical History:  Diagnosis Date  . Colitis   . Ovarian cyst     Patient Active Problem List   Diagnosis Date Noted  . UTI (lower urinary tract infection) 02/10/2016    History reviewed. No pertinent surgical history.  Prior to Admission medications   Medication Sig Start Date End Date Taking? Authorizing Provider  aluminum-magnesium hydroxide-simethicone (MAALOX) 200-200-20 MG/5ML SUSP Take 30 mLs by mouth 4 (four) times daily -  before meals and at bedtime. Patient not taking: Reported on 05/15/2018 08/26/17   Sharman CheekStafford, Phillip, MD  famotidine (PEPCID) 20 MG tablet Take 1 tablet (20 mg total) by mouth 2 (two) times daily. Patient not taking: Reported on 05/15/2018 08/26/17   Sharman CheekStafford, Phillip, MD  metoCLOPramide (REGLAN) 10 MG tablet Take 1 tablet (10 mg total) by mouth every 6 (six) hours as needed. Patient not taking: Reported on 05/15/2018 08/26/17   Sharman CheekStafford, Phillip, MD  ondansetron (ZOFRAN ODT) 4 MG disintegrating tablet Take 1 tablet (4 mg total) by mouth every 8 (eight) hours as needed for nausea or vomiting. Patient not taking: Reported on 02/20/2017 09/05/16   Sharman CheekStafford, Phillip, MD  promethazine (PHENERGAN) 12.5 MG tablet Take 1 tablet (12.5 mg total) by  mouth every 6 (six) hours as needed for nausea or vomiting. Patient not taking: Reported on 05/15/2018 02/20/17   Arnaldo NatalMalinda, Paul F, MD     Allergies Amoxicillin  No family history on file.  Social History Social History   Tobacco Use  . Smoking status: Never Smoker  . Smokeless tobacco: Never Used  Substance Use Topics  . Alcohol use: No  . Drug use: No    Review of Systems  Constitutional: No fever/chills Eyes: No visual changes.  ENT: No sore throat. Cardiovascular: Denies chest pain. Respiratory: No cough Gastrointestinal: As above.   Genitourinary: As above Musculoskeletal: Negative for back pain. Skin: Negative for rash. Neurological: Negative for headaches   ____________________________________________   PHYSICAL EXAM:  VITAL SIGNS: ED Triage Vitals  Enc Vitals Group     BP 05/15/18 0840 101/66     Pulse Rate 05/15/18 0840 64     Resp 05/15/18 0840 16     Temp 05/15/18 0840 98.6 F (37 C)     Temp Source 05/15/18 0840 Oral     SpO2 05/15/18 0840 100 %     Weight --      Height --      Head Circumference --      Peak Flow --      Pain Score 05/15/18 0837 8     Pain Loc --      Pain Edu? --      Excl. in GC? --     Constitutional: Alert and oriented. No acute  distress. Pleasant and interactive    Mouth/Throat: Mucous membranes are moist.    Cardiovascular: Normal rate, regular rhythm. Grossly normal heart sounds.  Good peripheral circulation. Respiratory: Normal respiratory effort.  No retractions. Lungs CTAB. Gastrointestinal: Soft and nontender. No distention.    Musculoskeletal: No lower extremity tenderness nor edema.  Warm and well perfused Neurologic:  Normal speech and language. No gross focal neurologic deficits are appreciated.  Skin:  Skin is warm, dry and intact. No rash noted. Psychiatric: Mood and affect are normal. Speech and behavior are normal.  ____________________________________________   LABS (all labs ordered are  listed, but only abnormal results are displayed)  Labs Reviewed  HCG, QUANTITATIVE, PREGNANCY - Abnormal; Notable for the following components:      Result Value   hCG, Beta Chain, Quant, S [id number] (*)    All other components within normal limits  URINALYSIS, COMPLETE (UACMP) WITH MICROSCOPIC - Abnormal; Notable for the following components:   Color, Urine YELLOW (*)    APPearance CLEAR (*)    All other components within normal limits  COMPREHENSIVE METABOLIC PANEL - Abnormal; Notable for the following components:   Glucose, Bld 103 (*)    All other components within normal limits  POCT PREGNANCY, URINE - Abnormal; Notable for the following components:   Preg Test, Ur POSITIVE (*)    All other components within normal limits  CBC  LIPASE, BLOOD   ____________________________________________  EKG  None ____________________________________________  RADIOLOGY  Ultrasound shows 6-week 2-day IUP ____________________________________________   PROCEDURES  Procedure(s) performed: No  Procedures   Critical Care performed: No ____________________________________________   INITIAL IMPRESSION / ASSESSMENT AND PLAN / ED COURSE  Pertinent labs & imaging results that were available during my care of the patient were reviewed by me and considered in my medical decision making (see chart for details).  Patient presents with complaints of severe morning sickness as well as lower pelvic cramping.  She denies vaginal discharge.  No vaginal bleeding.  She reports she is [redacted] weeks pregnant.  We will check beta-hCG, labs give IV fluids, IV Zofran, Tylenol for cramping obtain ultrasound and reevaluate  Lab work is overall unremarkable.  Ultrasound is quite normal.  Patient feels better after treatment.  She would like to go home.  We will follow-up with GYN.    ____________________________________________   FINAL CLINICAL IMPRESSION(S) / ED DIAGNOSES  Final diagnoses:  Abdominal pain  during pregnancy in first trimester  Constipation, unspecified constipation type        Note:  This document was prepared using Dragon voice recognition software and may include unintentional dictation errors.    Jene EveryKinner, Maricella Filyaw, MD 05/15/18 1336

## 2018-05-15 NOTE — ED Triage Notes (Signed)
abdmonital pain.  Is [redacted] weeks pregnant.

## 2019-08-23 IMAGING — US US OB TRANSVAGINAL
1 series · 14 of 28 positions shown · non-contrast
Comparison: None.

CLINICAL DATA: First trimester of pregnancy, abdominal cramping.

EXAM:
OBSTETRIC <14 WK US AND TRANSVAGINAL OB US
TECHNIQUE: Both transabdominal and transvaginal ultrasound examinations were
performed for complete evaluation of the gestation as well as the
maternal uterus, adnexal regions, and pelvic cul-de-sac.
Transvaginal technique was performed to assess early pregnancy.

[Series 1: us ob transvaginal · 0.20mm/px · 117 acquisitions, 14 frames shown]
[im 5/117]
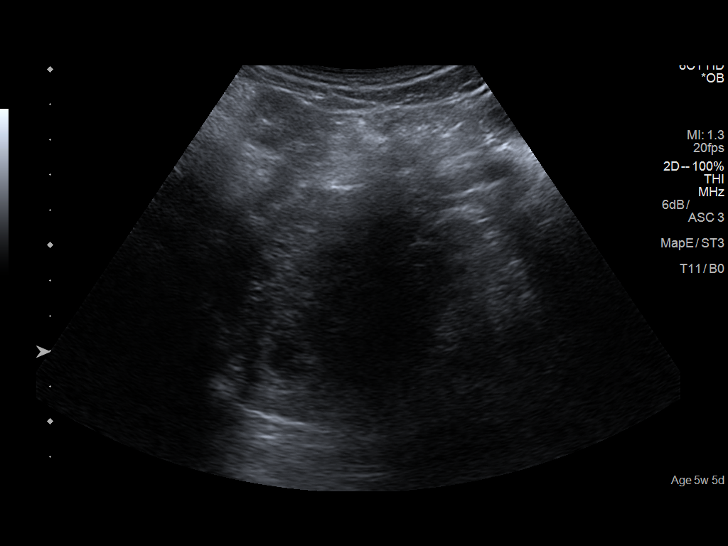
[im 13/117]
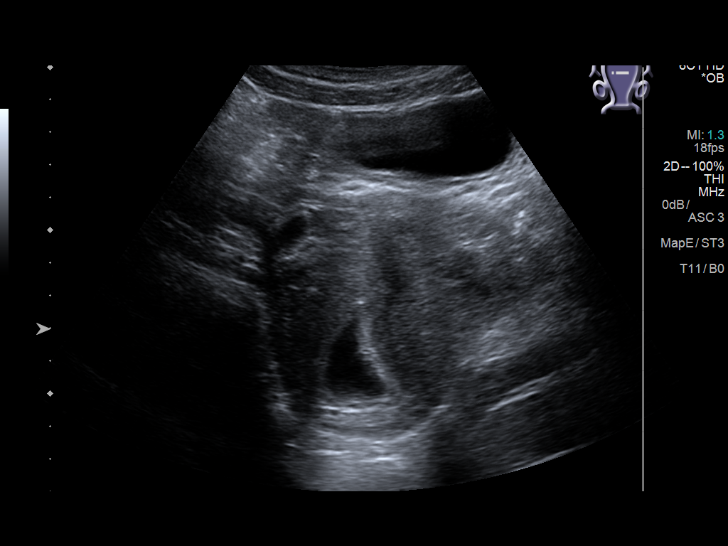
[im 22/117]
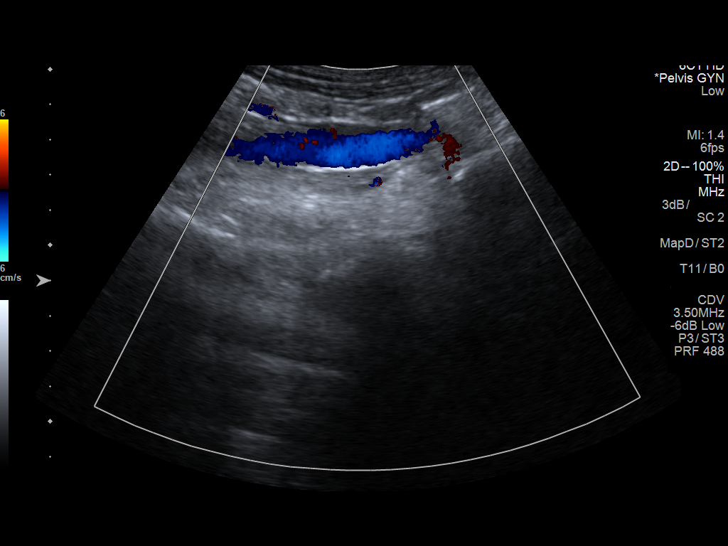
[im 31/117]
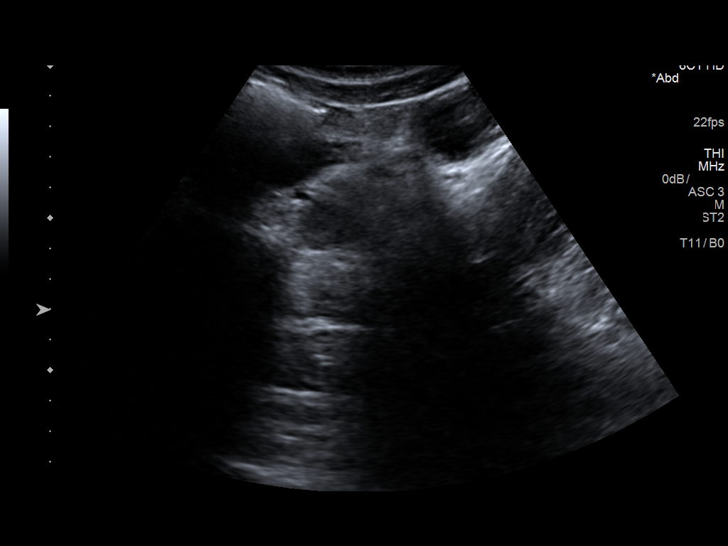
[im 39/117]
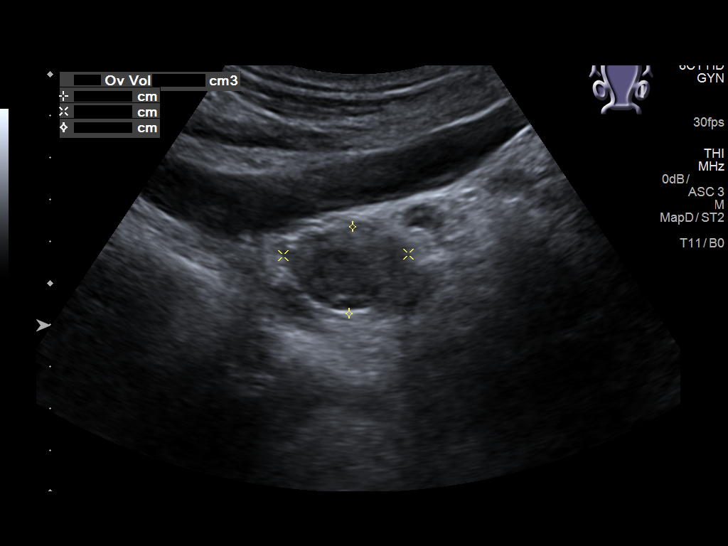
[im 48/117]
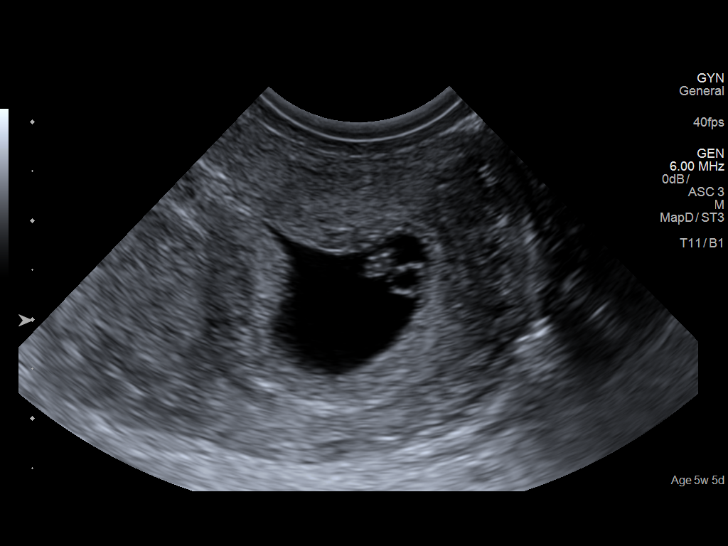
[im 56/117]
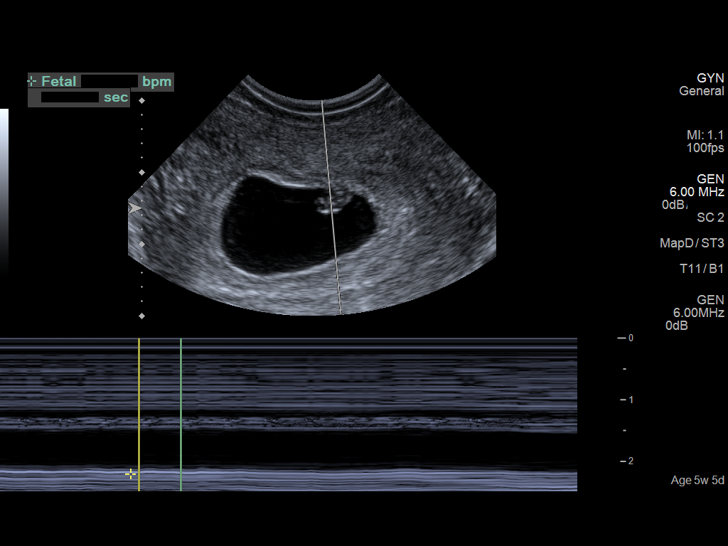
[im 65/117]
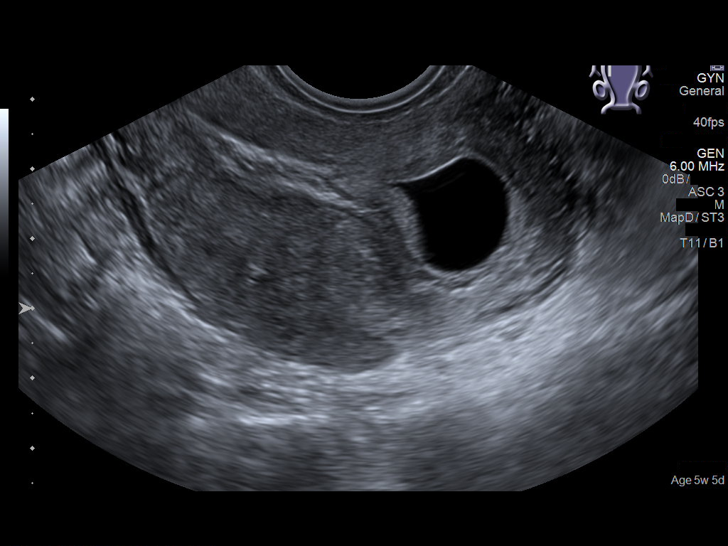
[im 74/117]
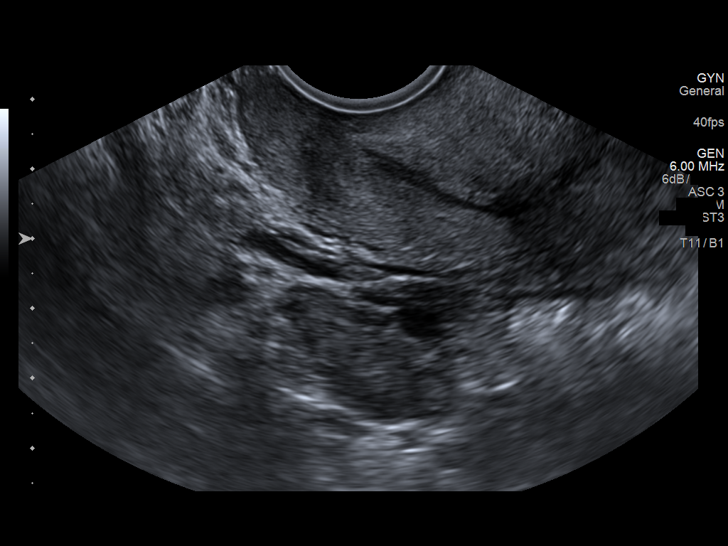
[im 82/117]
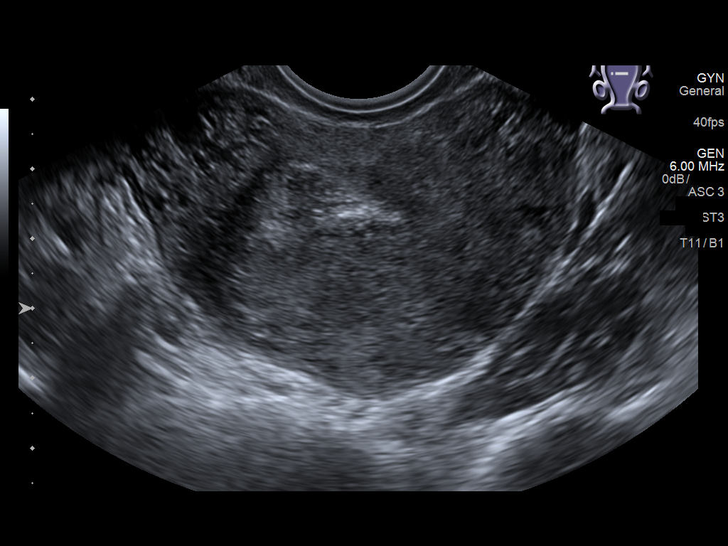
[im 91/117]
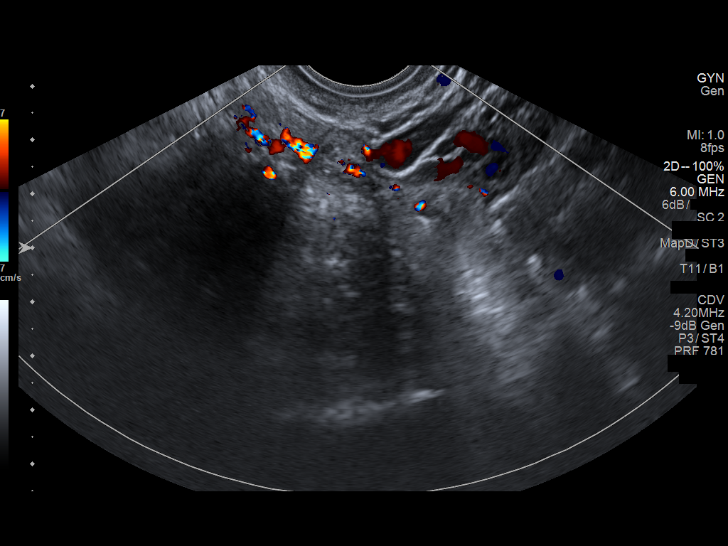
[im 99/117]
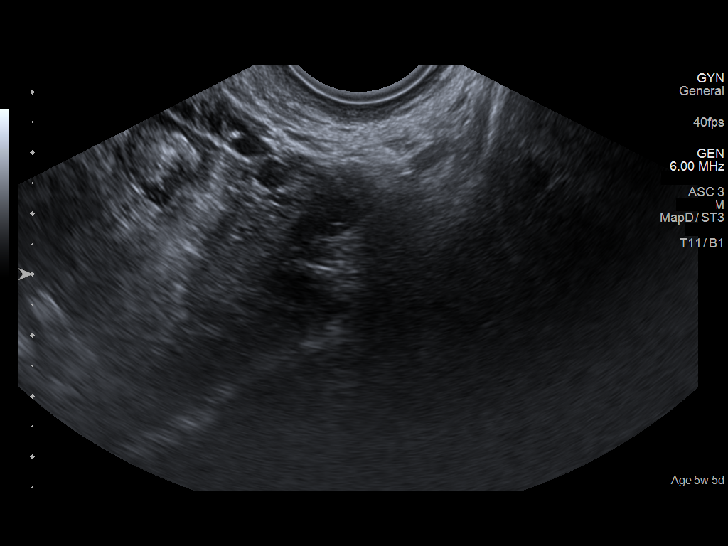
[im 108/117]
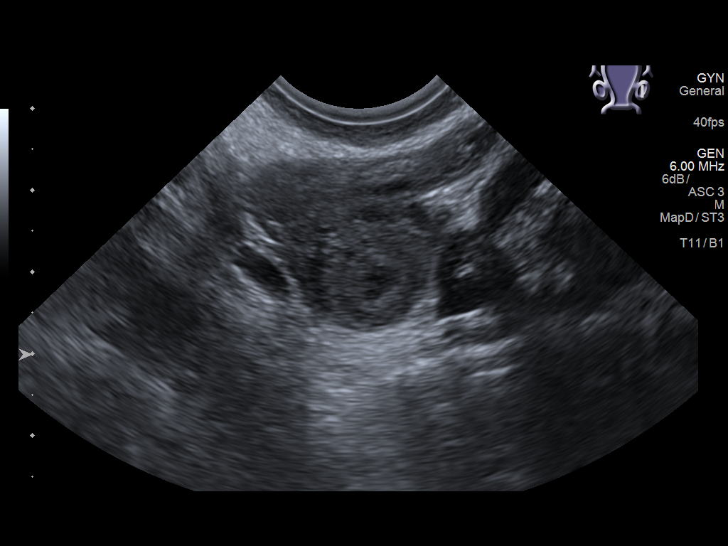
[im 117/117]
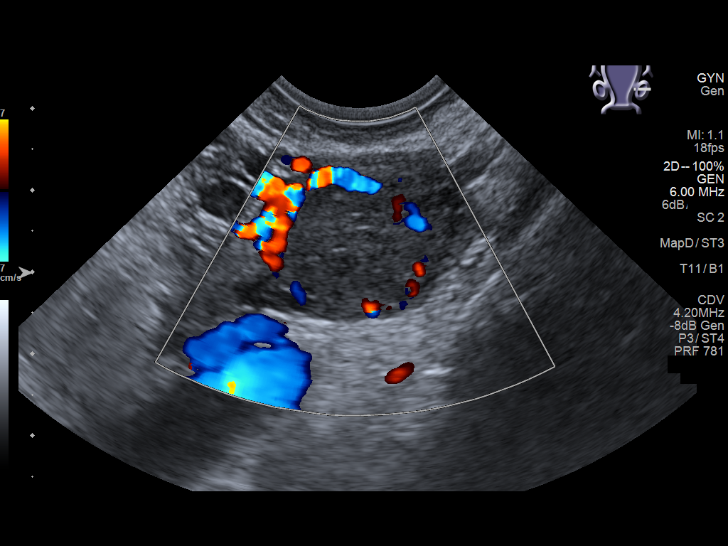

[14 of 28 positions shown; findings below may reference images not displayed]

FINDINGS: Intrauterine gestational sac: Single visualized.

Yolk sac:  Visualized.

Embryo:  Visualized.

Cardiac Activity: Visualized.

Heart Rate: 122 bpm

CRL:  5.6 mm   6 w   2 d                  US EDC: January 06, 2019.

Subchorionic hemorrhage:  None visualized.

Maternal uterus/adnexae: Right ovary appears normal. Probable corpus
luteum cyst seen in left ovary. No free fluid is noted.
IMPRESSION: Single live intrauterine gestation of 6 weeks 2 days.
